# Patient Record
Sex: Male | Born: 1980 | Race: Black or African American | Hispanic: No | Marital: Married | Smoking: Never smoker
Health system: Southern US, Community
[De-identification: ages and names within clinical notes are randomized; demographics above are authoritative.]

## PROBLEM LIST (undated history)

## (undated) DIAGNOSIS — K5090 Crohn's disease, unspecified, without complications: Secondary | ICD-10-CM

---

## 2018-09-08 ENCOUNTER — Ambulatory Visit (HOSPITAL_COMMUNITY)
Admission: EM | Admit: 2018-09-08 | Discharge: 2018-09-08 | Disposition: A | Discharge status: Home / Self Care | Payer: Medicaid Other | Attending: Family Medicine | Admitting: Family Medicine

## 2018-09-08 ENCOUNTER — Encounter (HOSPITAL_COMMUNITY): Payer: Self-pay | Admitting: Family Medicine

## 2018-09-08 DIAGNOSIS — R1084 Generalized abdominal pain: Secondary | ICD-10-CM

## 2018-09-08 DIAGNOSIS — R197 Diarrhea, unspecified: Secondary | ICD-10-CM

## 2018-09-08 MED ORDER — KETOROLAC TROMETHAMINE 30 MG/ML IJ SOLN
30.0000 mg | Freq: Once | INTRAMUSCULAR | Status: AC
  Administered 2018-09-08: 30 mg via INTRAMUSCULAR

## 2018-09-08 MED ORDER — DICYCLOMINE HCL 20 MG PO TABS: 20.0000 mg | ORAL_TABLET | Freq: Two times a day (BID) | ORAL | 0 refills | Status: DC

## 2018-09-08 MED ORDER — NAPROXEN 500 MG PO TABS: 500.0000 mg | ORAL_TABLET | Freq: Two times a day (BID) | ORAL | 0 refills | Status: DC

## 2018-09-08 MED ORDER — KETOROLAC TROMETHAMINE 30 MG/ML IJ SOLN
INTRAMUSCULAR | Status: AC
  Filled 2018-09-08: qty 1

## 2018-09-08 NOTE — ED Triage Notes (Signed)
Provider triage  

## 2018-09-08 NOTE — ED Provider Notes (Signed)
[facility]    CSN: [id number] Arrival date & time: 09/08/18  1531     History   Chief Complaint Chief Complaint  Patient presents with  . Abdominal Pain    HPI Calvin Pittman is a 37 y.o. male.   Patient is a 37 year old male with no known past medical history.  Presents today complaining of abdominal pain, back pain, neck pain and diarrhea.  He reports he has had back pain for over 12 years.  This episode has been worse over the last week.  He denies any cough, congestion, nausea, vomiting, fevers.  Denies any recent traveling out of the country.  He has taken medication before for pain but unsure of the name of it.  The pain in his back is worse with bending, moving, standing.  The neck pain is worse with rotation.    There is a language barrier.  All information obtained using the translator by phone.      History reviewed. No pertinent past medical history.  There are no active problems to display for this patient.   History reviewed. No pertinent surgical history.     Home Medications    Prior to Admission medications   Medication Sig Start Date End Date Taking? Authorizing Provider  dicyclomine (BENTYL) 20 MG tablet Take 1 tablet (20 mg total) by mouth 2 (two) times daily. 09/08/18   Dahlia ByesBast, Zacharius Funari A, NP  naproxen (NAPROSYN) 500 MG tablet Take 1 tablet (500 mg total) by mouth 2 (two) times daily. 09/08/18   Janace ArisBast, Averil Digman A, NP    Family History History reviewed. No pertinent family history.  Social History Social History   Tobacco Use  . Smoking status: Never Smoker  . Smokeless tobacco: Never Used  Substance Use Topics  . Alcohol use: Never    Frequency: Never  . Drug use: Never     Allergies   Patient has no known allergies.   Review of Systems Review of Systems   Physical Exam Triage Vital Signs ED Triage Vitals [09/08/18 1613]  Enc Vitals Group     BP 104/62     Pulse Rate 87     Resp 18     Temp 98.6 F (37 C)     Temp  Source Oral     SpO2 100 %     Weight      Height      Head Circumference      Peak Flow      Pain Score      Pain Loc      Pain Edu?      Excl. in GC?    No data found.  Updated Vital Signs BP 104/62 (BP Location: Left Arm)   Pulse 87   Temp 98.6 F (37 C) (Oral)   Resp 18   SpO2 100%   Visual Acuity Right Eye Distance:   Left Eye Distance:   Bilateral Distance:    Right Eye Near:   Left Eye Near:    Bilateral Near:     Physical Exam  Constitutional: He appears well-developed and well-nourished.  Very pleasant. Non toxic or ill appearing.     HENT:  Head: Normocephalic and atraumatic.  Pulmonary/Chest: Effort normal.  Abdominal: Soft. Normal appearance. There is generalized tenderness. There is no rebound and no CVA tenderness.  Abdomen soft, generalized tenderness. No CVA tenderness. No rebound tenderness.     Musculoskeletal:  TTP of the thoracic spine. No swelling, bruising or deformity.  Tenderness to right and left lateral neck with rotation.   Neurological: He is alert.  Skin: Skin is warm and dry.  Psychiatric: He has a normal mood and affect.  Nursing note and vitals reviewed.    UC Treatments / Results  Labs (all labs ordered are listed, but only abnormal results are displayed) Labs Reviewed - No data to display  EKG None  Radiology No results found.  Procedures Procedures (including critical care time)  Medications Ordered in UC Medications  ketorolac (TORADOL) 30 MG/ML injection 30 mg (30 mg Intramuscular Given 09/08/18 1701)    Initial Impression / Assessment and Plan / UC Course  I have reviewed the triage vital signs and the nursing notes.  Pertinent labs & imaging results that were available during my care of the patient were reviewed by me and considered in my medical decision making (see chart for details).    Gastroenteritis/ Musculoskeletal pain.  Language barrier made it very hard to obtain a good history.  Toradol  injection given here for pain. Sent home with prescriptions for diarrhea, abd cramping and pain in the neck and back.  He denies recent traveling outside the country. Less concern for travelers diarrhea.  He is non toxic or ill appearing  Vitals normal. No fever.   Final Clinical Impressions(s) / UC Diagnoses   Final diagnoses:  Generalized abdominal pain  Diarrhea, unspecified type     Discharge Instructions     It was nice meeting you!!  We gave you medication for diarrhea and pain. Follow up as needed for continued or worsening symptoms      ED Prescriptions    Medication Sig Dispense Auth. Provider   dicyclomine (BENTYL) 20 MG tablet Take 1 tablet (20 mg total) by mouth 2 (two) times daily. 20 tablet Vanessia Bokhari A, NP   naproxen (NAPROSYN) 500 MG tablet Take 1 tablet (500 mg total) by mouth 2 (two) times daily. 30 tablet Dahlia Byes A, NP     Controlled Substance Prescriptions [place] Controlled Substance Registry consulted? Not Applicable   Janace Aris, NP 09/08/18 1711

## 2018-09-08 NOTE — Discharge Instructions (Addendum)
It was nice meeting you!!  We gave you medication for diarrhea and pain. Follow up as needed for continued or worsening symptoms

## 2018-09-29 ENCOUNTER — Ambulatory Visit
Admission: RE | Admit: 2018-09-29 | Discharge: 2018-09-29 | Disposition: A | Discharge status: Home / Self Care | Payer: No Typology Code available for payment source | Source: Ambulatory Visit | Attending: Internal Medicine | Admitting: Internal Medicine

## 2018-09-29 ENCOUNTER — Other Ambulatory Visit: Payer: Self-pay | Admitting: Internal Medicine

## 2018-09-29 DIAGNOSIS — A150 Tuberculosis of lung: Secondary | ICD-10-CM

## 2018-10-14 ENCOUNTER — Other Ambulatory Visit: Payer: Self-pay | Admitting: Family Medicine

## 2018-10-14 ENCOUNTER — Encounter: Payer: Self-pay | Admitting: Family Medicine

## 2018-10-14 ENCOUNTER — Ambulatory Visit (INDEPENDENT_AMBULATORY_CARE_PROVIDER_SITE_OTHER): Payer: Medicaid Other | Admitting: Family Medicine

## 2018-10-14 ENCOUNTER — Telehealth: Payer: Self-pay

## 2018-10-14 VITALS — BP 89/47 | HR 93 | Temp 97.5°F | Resp 17 | Ht 69.5 in | Wt 104.2 lb

## 2018-10-14 DIAGNOSIS — I950 Idiopathic hypotension: Secondary | ICD-10-CM

## 2018-10-14 DIAGNOSIS — R64 Cachexia: Secondary | ICD-10-CM | POA: Diagnosis not present

## 2018-10-14 DIAGNOSIS — M2560 Stiffness of unspecified joint, not elsewhere classified: Secondary | ICD-10-CM

## 2018-10-14 DIAGNOSIS — E049 Nontoxic goiter, unspecified: Secondary | ICD-10-CM | POA: Diagnosis not present

## 2018-10-14 DIAGNOSIS — K2100 Gastro-esophageal reflux disease with esophagitis, without bleeding: Secondary | ICD-10-CM

## 2018-10-14 DIAGNOSIS — R52 Pain, unspecified: Secondary | ICD-10-CM

## 2018-10-14 MED ORDER — METHYLPREDNISOLONE SODIUM SUCC 125 MG IJ SOLR
80.0000 mg | Freq: Once | INTRAMUSCULAR | Status: AC
  Administered 2018-10-14: 80 mg via INTRAMUSCULAR

## 2018-10-14 MED ORDER — OMEPRAZOLE 40 MG PO CPDR: 40.0000 mg | DELAYED_RELEASE_CAPSULE | Freq: Every day | ORAL | 3 refills | Status: DC

## 2018-10-14 MED ORDER — PREDNISONE 20 MG PO TABS: ORAL_TABLET | ORAL | 0 refills | Status: DC

## 2018-10-14 NOTE — Telephone Encounter (Signed)
Called Language [phone]) to schedule an interpreter for patient's next scheduled appointment. Information was taken & they will call back to confirm.

## 2018-10-14 NOTE — Progress Notes (Signed)
Calvin Pittman, is a 37 y.o. male  ZOX:[id number]  [id number]:[id number]  DOB - Jun 28, 1981  CC:  Chief Complaint  Patient presents with  . Establish Care  . Back Pain    c/o back pain & stiffness off and on for 12 years. has pain with bending & certain movement. also has tightness around his shoulder  . Neck Pain    neck pain & stiffness. in order to look to his left he has to turn his whole side.  . Abdominal Pain    was seen at the urgent care & prescribed bentyl. states that that did not help the pain       HPI: Calvin Pittman is a 37 y.o. male is here today to establish care.   Calvin Pittman  Patient is a [place]  refugee and has only been in this country for 30 days. Initially attempted visit with interpreter service, however, technically problems prevented interpreter from translating information during visit. Unable to obtain another interpreter on translation line that speaks preferred language.  Proceeded with visit, patient speaks some English although reports understanding.  Today's visit:  Patient had presented to Urgent Care on 9/12/19n with a complaint of generalized abdominal pain, back pain, neck pain, and diarrhea. Back and neck pain he reports has been present for 12 years without known injury. He treated with Toradol and discharged to follow-up and establish with a PCP. On arrival, patient is extremely hypotensive. Reports his blood pressure is always really low. He asymptomatic of dizziness or weakness. He continues to complain of symptoms of generalized abdominal pain with intake of food which worsen as night. Unable to characterize pain. He reports he has had two endoscopies in this country and placed on medication without relief of abdominal pain symptoms. He is cachetic with a BMI Body mass index is 15.17 kg/m.  Back pain present all the time. He complains of associated neck pain. No prior injury.  He has taken Naproxen without relief. He is unable to completely rotate  his neck to the left. Complains of chronic neck stiffness. He has an enlarged thyroid. No known history of thyroid disease. Denies chest pain, shortness of breath,cough, night sweats, or chills.  Current medications: Current Outpatient Medications:  .  omeprazole (PRILOSEC) 40 MG capsule, Take 1 capsule (40 mg total) by mouth daily., Disp: 30 capsule, Rfl: 3 .  predniSONE (DELTASONE) 20 MG tablet, Take 3 PO QAM x3days, 2 PO QAM x3days, 1 PO QAM x3days, Disp: 18 tablet, Rfl: 0   Pertinent family medical history: family history is not on file.   No Known Allergies  Social History   Socioeconomic History  . Marital status: Married    Spouse name: Not on file  . Number of children: Not on file  . Years of education: Not on file  . Highest education level: Not on file  Occupational History  . Not on file  Social Needs  . Financial resource strain: Not on file  . Food insecurity:    Worry: Not on file    Inability: Not on file  . Transportation needs:    Medical: Not on file    Non-medical: Not on file  Tobacco Use  . Smoking status: Never Smoker  . Smokeless tobacco: Never Used  Substance and Sexual Activity  . Alcohol use: Never    Frequency: Never  . Drug use: Never  . Sexual activity: Not on file  Lifestyle  . Physical activity:    Days per week: Not on  file    Minutes per session: Not on file  . Stress: Not on file  Relationships  . Social connections:    Talks on phone: Not on file    Gets together: Not on file    Attends religious service: Not on file    Active member of club or organization: Not on file    Attends meetings of clubs or organizations: Not on file    Relationship status: Not on file  . Intimate partner violence:    Fear of current or ex partner: Not on file    Emotionally abused: Not on file    Physically abused: Not on file    Forced sexual activity: Not on file  Other Topics Concern  . Not on file  Social History Narrative  . Not on file     Review of Systems: Pertinent negatives listed in HPI  Objective:   Vitals:   10/14/18 1107  BP: (!) 89/47  Pulse: 93  Resp: 17  Temp: (!) 97.5 F (36.4 C)  SpO2: 98%    BP Readings from Last 3 Encounters:  10/14/18 (!) 89/47  09/08/18 104/62    Filed Weights   10/14/18 1107  Weight: 104 lb 3.2 oz (47.3 kg)     Physical Exam  Constitutional: He is oriented to person, place, and time. He appears cachectic. He is active and cooperative.  HENT:  Head: Normocephalic.  Right Ear: External ear normal.  Left Ear: External ear normal.  Neck: Thyromegaly present.  Limited ROM left lateral rotation  Cardiovascular: Normal rate, regular rhythm, normal heart sounds and intact distal pulses.  Pulmonary/Chest: Effort normal and breath sounds normal.  Musculoskeletal: He exhibits no edema.  Neurological: He is alert and oriented to person, place, and time. No cranial nerve deficit. Coordination normal.  Skin: Skin is warm and dry.  Psychiatric: He has a normal mood and affect. His behavior is normal. Judgment and thought content normal.  Lab Results (prior encounters)  No results found for: WBC, HGB, HCT, MCV, PLT Lab Results  Component Value Date   CREATININE 0.74 (L) 10/14/2018   BUN 9 10/14/2018   NA 139 10/14/2018   K 4.7 10/14/2018   CL 99 10/14/2018   CO2 26 10/14/2018       Assessment and plan:  1. Cachexia (HCC), Body mass index is 15.17 kg/m.' - CBC with Differential; Future - Comprehensive metabolic panel; Future - Hemoglobinopathy evaluation; Future - HIV Antibody (routine testing w rflx) - HELICOBACTER PYLORI  ANTIBODY, IGM; Future  2. Goiter,  -Patient will likely warrant a referral to ENT as goiter is severely enlarged. Will refer to endocrinology if thyroid functioning is abnormal. - Thyroid Panel With TSH; Future - PTH, Intact and Calcium; Future  3. Generalized stiffness, chronic - CBC with Differential; Future - Vitamin B12; Future - Vitamin  D, 25-hydroxy; Future - Sedimentation Rate; Future - HELICOBACTER PYLORI  ANTIBODY, IGM; Future - methylPREDNISolone sodium succinate (SOLU-MEDROL) 125 mg/2 mL injection 80 mg - C-reactive protein; Future  4. Idiopathic hypotension, asymptomatic  -Increase hydration with water.   5. Generalized pain, - C-reactive protein, if elevated will consider adding Rheumatoid panel and ANA to rule out autoimmune etiology  6. Gastroesophageal reflux disease with esophagitis, resume omeprazole  Return in about 1 week (around 10/21/2018).   The patient was given clear instructions to go to ER or return to medical center if symptoms don't improve, worsen or new problems develop. The patient verbalized understanding. The patient was  advised  to call and obtain lab results if they haven't heard anything from out office within 7-10 business days.  A total of 50 minutes spent, greater than 50 % of this time was spent, communicating given language barrier, counseling and coordination of care.  Joaquin Courts, FNP Primary Care at [facility] [address] [place] [address] [phone]: [phone]    This note has been created with Dragon speech recognition software and Paediatric nurse. Any transcriptional errors are unintentional.

## 2018-10-14 NOTE — Patient Instructions (Signed)
Go to [facility] to have lab work drawn before starting medication.  Your medication are at the pharmacy.

## 2018-10-17 LAB — HELICOBACTER PYLORI  ANTIBODY, IGM: H pylori, IgM Abs: <9 units (ref 0.0–8.9)

## 2018-10-17 LAB — COMPREHENSIVE METABOLIC PANEL
A/G RATIO: 0.8 — AB (ref 1.2–2.2)
ALK PHOS: 124 IU/L — AB (ref 39–117)
ALT: 6 IU/L (ref 0–44)
AST: 18 IU/L (ref 0–40)
Albumin: 3.5 g/dL (ref 3.5–5.5)
BUN/Creatinine Ratio: 12 (ref 9–20)
BUN: 9 mg/dL (ref 6–20)
Bilirubin Total: 0.2 mg/dL (ref 0.0–1.2)
CHLORIDE: 99 mmol/L (ref 96–106)
CO2: 26 mmol/L (ref 20–29)
Calcium: 9.1 mg/dL (ref 8.7–10.2)
Creatinine, Ser: 0.74 mg/dL — ABNORMAL LOW (ref 0.76–1.27)
GFR calc Af Amer: 138 mL/min/{1.73_m2} (ref >59)
GFR calc non Af Amer: 120 mL/min/{1.73_m2} (ref >59)
GLOBULIN, TOTAL: 4.3 g/dL (ref 1.5–4.5)
Glucose: 95 mg/dL (ref 65–99)
POTASSIUM: 4.7 mmol/L (ref 3.5–5.2)
SODIUM: 139 mmol/L (ref 134–144)
Total Protein: 7.8 g/dL (ref 6.0–8.5)

## 2018-10-17 LAB — CBC WITH DIFFERENTIAL/PLATELET
BASOS: 0 %
Basophils Absolute: 0 10*3/uL (ref 0.0–0.2)
EOS (ABSOLUTE): 0 10*3/uL (ref 0.0–0.4)
EOS: 1 %
HEMATOCRIT: 36.4 % — AB (ref 37.5–51.0)
Hemoglobin: 10.9 g/dL — ABNORMAL LOW (ref 13.0–17.7)
IMMATURE GRANULOCYTES: 0 %
Immature Grans (Abs): 0 10*3/uL (ref 0.0–0.1)
LYMPHS ABS: 0.6 10*3/uL — AB (ref 0.7–3.1)
Lymphs: 12 %
MCH: 20.8 pg — ABNORMAL LOW (ref 26.6–33.0)
MCHC: 29.9 g/dL — AB (ref 31.5–35.7)
MCV: 69 fL — AB (ref 79–97)
MONOS ABS: 0.1 10*3/uL (ref 0.1–0.9)
Monocytes: 2 %
NEUTROS PCT: 85 %
Neutrophils Absolute: 4.1 10*3/uL (ref 1.4–7.0)
PLATELETS: 441 10*3/uL (ref 150–450)
RBC: 5.25 x10E6/uL (ref 4.14–5.80)
RDW: 17.7 % — AB (ref 12.3–15.4)
WBC: 4.8 10*3/uL (ref 3.4–10.8)

## 2018-10-17 LAB — PTH, INTACT AND CALCIUM: PTH: 24 pg/mL (ref 15–65)

## 2018-10-17 LAB — THYROID PANEL WITH TSH
Free Thyroxine Index: 2.7 (ref 1.2–4.9)
T3 Uptake Ratio: 31 % (ref 24–39)
T4 TOTAL: 8.7 ug/dL (ref 4.5–12.0)
TSH: 2.25 u[IU]/mL (ref 0.450–4.500)

## 2018-10-17 LAB — HEMOGLOBINOPATHY EVALUATION
HGB A: 98.1 % (ref 96.4–98.8)
HGB C: 0 %
HGB S: 0 %
HGB VARIANT: 0 %
Hemoglobin A2 Quantitation: 1.9 % (ref 1.8–3.2)
Hemoglobin F Quantitation: 0 % (ref 0.0–2.0)

## 2018-10-17 LAB — HIV ANTIBODY (ROUTINE TESTING W REFLEX): HIV Screen 4th Generation wRfx: NONREACTIVE

## 2018-10-17 LAB — VITAMIN B12: Vitamin B-12: 552 pg/mL (ref 232–1245)

## 2018-10-17 LAB — VITAMIN D 25 HYDROXY (VIT D DEFICIENCY, FRACTURES): Vit D, 25-Hydroxy: 13 ng/mL — ABNORMAL LOW (ref 30.0–100.0)

## 2018-10-17 LAB — C-REACTIVE PROTEIN: CRP: 81 mg/L — ABNORMAL HIGH (ref 0–10)

## 2018-10-17 LAB — SEDIMENTATION RATE: SED RATE: 69 mm/h — AB (ref 0–15)

## 2018-10-21 ENCOUNTER — Encounter: Payer: Self-pay | Admitting: Family Medicine

## 2018-10-21 ENCOUNTER — Ambulatory Visit (INDEPENDENT_AMBULATORY_CARE_PROVIDER_SITE_OTHER): Payer: Medicaid Other | Admitting: Family Medicine

## 2018-10-21 ENCOUNTER — Telehealth: Payer: Self-pay

## 2018-10-21 VITALS — BP 107/69 | HR 89 | Temp 97.4°F | Resp 17 | Ht 69.5 in | Wt 108.6 lb

## 2018-10-21 DIAGNOSIS — R7989 Other specified abnormal findings of blood chemistry: Secondary | ICD-10-CM | POA: Diagnosis not present

## 2018-10-21 DIAGNOSIS — M2560 Stiffness of unspecified joint, not elsewhere classified: Secondary | ICD-10-CM

## 2018-10-21 DIAGNOSIS — D649 Anemia, unspecified: Secondary | ICD-10-CM

## 2018-10-21 DIAGNOSIS — R195 Other fecal abnormalities: Secondary | ICD-10-CM

## 2018-10-21 DIAGNOSIS — R64 Cachexia: Secondary | ICD-10-CM | POA: Diagnosis not present

## 2018-10-21 DIAGNOSIS — Z789 Other specified health status: Secondary | ICD-10-CM

## 2018-10-21 MED ORDER — FERROUS SULFATE 325 (65 FE) MG PO TABS: 325.0000 mg | ORAL_TABLET | Freq: Two times a day (BID) | ORAL | 1 refills | Status: DC

## 2018-10-21 MED ORDER — VITAMIN D (ERGOCALCIFEROL) 1.25 MG (50000 UNIT) PO CAPS: 50000.0000 [IU] | ORAL_CAPSULE | ORAL | 0 refills | Status: DC

## 2018-10-21 NOTE — Progress Notes (Signed)
Patient ID: Calvin Pittman, male    DOB: 1981-12-26, 37 y.o.   MRN: [id number]  PCP: Scot Jun, FNP  Chief Complaint  Patient presents with  . Results    here to go over recent labs   Interpreter used to facilitate continuum of care and promote health. Subjective:  HPI Calvin Pittman is a 37 y.o. male presents for a follow-up visit. Patient established care and was evaluated on 10/14/2018 for multiple complaints including generalized abdominal pain, diarrhea, hypotension, and generalized body aches. He was placed on omeprazole for GI symptoms and prednisone for generalized body pain. Reports today increased appetite and pain has improved. He continues to have loose stool although not diarrhea. He has had problems receiving adequate nutrition as he has not received his nutritional supplement card. Lab were significant for severe vitamin D deficiency and microcytic anemia.nonspecific labs ESR and sed rate were elevated, however, there no comparison values available to determine patient's baseline. Social History   Socioeconomic History  . Marital status: Married    Spouse name: Not on file  . Number of children: Not on file  . Years of education: Not on file  . Highest education level: Not on file  Occupational History  . Not on file  Social Needs  . Financial resource strain: Not on file  . Food insecurity:    Worry: Not on file    Inability: Not on file  . Transportation needs:    Medical: Not on file    Non-medical: Not on file  Tobacco Use  . Smoking status: Never Smoker  . Smokeless tobacco: Never Used  Substance and Sexual Activity  . Alcohol use: Never    Frequency: Never  . Drug use: Never  . Sexual activity: Not on file  Lifestyle  . Physical activity:    Days per week: Not on file    Minutes per session: Not on file  . Stress: Not on file  Relationships  . Social connections:    Talks on phone: Not on file    Gets together: Not on file   Attends religious service: Not on file    Active member of club or organization: Not on file    Attends meetings of clubs or organizations: Not on file    Relationship status: Not on file  . Intimate partner violence:    Fear of current or ex partner: Not on file    Emotionally abused: Not on file    Physically abused: Not on file    Forced sexual activity: Not on file  Other Topics Concern  . Not on file  Social History Narrative  . Not on file    No family history on file.   Review of Systems Pertinent negatives listed in HPI Prior to Admission medications   Medication Sig Start Date End Date Taking? Authorizing Provider  omeprazole (PRILOSEC) 40 MG capsule Take 1 capsule (40 mg total) by mouth daily. 10/14/18  Yes Scot Jun, FNP  predniSONE (DELTASONE) 20 MG tablet Take 3 PO QAM x3days, 2 PO QAM x3days, 1 PO QAM x3days 10/14/18  Yes Scot Jun, FNP    Past Medical, Surgical Family and Social History reviewed and updated.    Objective:   Today's Vitals   10/21/18 1056  BP: 107/69  Pulse: 89  Resp: 17  Temp: (!) 97.4 F (36.3 C)  TempSrc: Oral  SpO2: 99%  Weight: 108 lb 9.6 oz (49.3 kg)  Height: 5' 9.5" (1.765 m)  Wt Readings from Last 3 Encounters:  10/21/18 108 lb 9.6 oz (49.3 kg)  10/14/18 104 lb 3.2 oz (47.3 kg)     Physical Exam General appearance: alert, cachexia , cooperative and in no distress Head: Normocephalic, without obvious abnormality, atraumatic Respiratory: Respirations even and unlabored, normal respiratory rate Heart: rate and rhythm normal. No gallop or murmurs noted on exam  Extremities: No gross deformities Skin: Skin color, texture, turgor normal. No rashes seen  Psych: Appropriate mood and affect. Neurologic: Mental status: Alert, oriented to person, place, and time, thought content appropriate.   Assessment & Plan:  1. Cachexia [facility]) -Information given to patient regarding food resources available. -SNAP  benefits pending  - Will closely monitor weight at subsequent encounters.  2. Loose stools -suspect this poor fiber intake related. -provided information regarding improving fiber intake - if persists , will trial IBS treatment   3. Generalized stiffness, improving -complete prednisone   4. Low serum vitamin D -likely cause of muscle pain and stiffness complaint -start ergocalciferol 50,000 units weekly x 12 weeks  -repeat level in 12 weeks   5. Anemia, unspecified type -start oral iron replacement -check iron panel at 6 week follow-up  6. Language Barrier interpreter used to facilitate continuum of care and promote health.  Meds ordered this encounter  Medications  . Vitamin D, Ergocalciferol, (DRISDOL) 50000 units CAPS capsule    Sig: Take 1 capsule (50,000 Units total) by mouth every 7 (seven) days.    Dispense:  24 capsule    Refill:  0  . ferrous sulfate (FERROUSUL) 325 (65 FE) MG tablet    Sig: Take 1 tablet (325 mg total) by mouth 2 (two) times daily with a meal.    Dispense:  60 tablet    Refill:  1   A total of 30  minutes spent, greater than 50 % of this time was spent counseling and coordination of care.  -The patient was given clear instructions to go to ER or return to medical center if symptoms do not improve, worsen or new problems develop. The patient verbalized understanding.    Molli Barrows, FNP Primary Care at [facility] [address] [place] [phone]: [phone]

## 2018-10-21 NOTE — Patient Instructions (Addendum)
For food pantry go to:  [facility]  [address] [place], [place] [address] [phone]     Ergocalciferol, Vitamin D2 oral drops and solution What is this medicine? ERGOCALCIFEROL (er goe kal SIF e role) is a man-made vitamin D. It helps the body maintain the right amount of calcium and phosphorus. This medicine is used to prevent and to treat low vitamin D, to promote strong bones and teeth, and in the treatment of some diseases. This medicine may be used for other purposes; ask your health care provider or pharmacist if you have questions. COMMON BRAND NAME(S): Calcidol, Calciferol, Drisdol What should I tell my health care provider before I take this medicine? They need to know if you have any of the following conditions: -kidney disease -liver disease -parathyroid disease -stomach problems -an unusual or allergic reaction to vitamin D, other medicines, foods, dyes, or preservatives -pregnant or trying to get pregnant -breast-feeding How should I use this medicine? Take this medicine by mouth. Follow the directions on the prescription label. Use a specially marked spoon or container to measure each dose. Ask your pharmacist if you do not have one. Household spoons are not accurate. This medicine can be taken directly into the mouth or added to cereal, fruit juice, or other foods. Take your medicine at regular intervals. Do not take it more often than directed. Do not stop taking except on your doctor's advice. Talk to your pediatrician regarding the use of this medicine in children. While this drug may be prescribed for selected conditions, precautions do apply. Overdosage: If you think you have taken too much of this medicine contact a poison control center or emergency room at once. NOTE: This medicine is only for you. Do not share this medicine with others. What if I miss a dose? If you miss a dose, take it as soon as you can. If it is almost time for your  next dose, take only that dose. Do not take double or extra doses. What may interact with this medicine? -antacids -digoxin -diuretics -medicines for cholesterol like colestipol or cholestyramine -medicines to treat seizures or nerve pain -mineral oil -orlistat This list may not describe all possible interactions. Give your health care provider a list of all the medicines, herbs, non-prescription drugs, or dietary supplements you use. Also tell them if you smoke, drink alcohol, or use illegal drugs. Some items may interact with your medicine. What should I watch for while using this medicine? Visit your doctor or health care professional for regular checks on your progress. Your doctor may order blood work while you are taking this vitamin. You may need a special diet or other supplements. Ask your doctor or health care professional before you take any non-prescription medicines that have vitamin D, phosphorus, magnesium, or calcium (like antacids) in them. This can cause side effects. Take this vitamin only if your doctor prescribes it or if you do not receive enough vitamins in your diet. A balanced diet should give you all of the vitamins you need. Fish and fish liver oils naturally contain vitamin D, and vitamin D is added to milk and bread. Also, your body makes vitamin D when you are in the sun. What side effects may I notice from receiving this medicine? Side effects that you should report to your doctor or health care professional as soon as possible: -allergic reactions like skin rash, itching or hives, swelling of the face, lips, or tongue -bone pain -high blood pressure -increased  thirst -increased urination (especially at night) -irregular heartbeat -seizures -unusually weak or tired Side effects that usually do not require medical attention (report to your doctor or health care professional if they continue or are bothersome): -constipation -dry mouth -headache -loss of  appetite -metallic taste -stomach upset This list may not describe all possible side effects. Call your doctor for medical advice about side effects. You may report side effects to FDA at 1-800-FDA-1088. Where should I keep my medicine? Keep out of the reach of children. Store at room temperature between 15 and 30 degrees C (59 and 86 degrees F). Protect from light. Keep container tightly closed. Throw away any unused medicine after the expiration date. NOTE: This sheet is a summary. It may not cover all possible information. If you have questions about this medicine, talk to your doctor, pharmacist, or health care provider.  2018 Elsevier/Gold Standard (2008-04-17 11:33:11)   High-Fiber Diet Fiber, also called dietary fiber, is a type of carbohydrate found in fruits, vegetables, whole grains, and beans. A high-fiber diet can have many health benefits. Your health care provider may recommend a high-fiber diet to help:  Prevent constipation. Fiber can make your bowel movements more regular.  Lower your cholesterol.  Relieve hemorrhoids, uncomplicated diverticulosis, or irritable bowel syndrome.  Prevent overeating as part of a weight-loss plan.  Prevent heart disease, type 2 diabetes, and certain cancers.  What is my plan? The recommended daily intake of fiber includes:  38 grams for men under age 11.  30 grams for men over age 29.  25 grams for women under age 41.  21 grams for women over age 61.  You can get the recommended daily intake of dietary fiber by eating a variety of fruits, vegetables, grains, and beans. Your health care provider may also recommend a fiber supplement if it is not possible to get enough fiber through your diet. What do I need to know about a high-fiber diet?  Fiber supplements have not been widely studied for their effectiveness, so it is better to get fiber through food sources.  Always check the fiber content on thenutrition facts label of any  prepackaged food. Look for foods that contain at least 5 grams of fiber per serving.  Ask your dietitian if you have questions about specific foods that are related to your condition, especially if those foods are not listed in the following section.  Increase your daily fiber consumption gradually. Increasing your intake of dietary fiber too quickly may cause bloating, cramping, or gas.  Drink plenty of water. Water helps you to digest fiber. What foods can I eat? Grains Whole-grain breads. Multigrain cereal. Oats and oatmeal. Brown rice. Barley. Bulgur wheat. Millet. Bran muffins. Popcorn. Rye wafer crackers. Vegetables Sweet potatoes. Spinach. Kale. Artichokes. Cabbage. Broccoli. Green peas. Carrots. Squash. Fruits Berries. Pears. Apples. Oranges. Avocados. Prunes and raisins. Dried figs. Meats and Other Protein Sources Navy, kidney, pinto, and soy beans. Split peas. Lentils. Nuts and seeds. Dairy Fiber-fortified yogurt. Beverages Fiber-fortified soy milk. Fiber-fortified orange juice. Other Fiber bars. The items listed above may not be a complete list of recommended foods or beverages. Contact your dietitian for more options. What foods are not recommended? Grains White bread. Pasta made with refined flour. White rice. Vegetables Fried potatoes. Canned vegetables. Well-cooked vegetables. Fruits Fruit juice. Cooked, strained fruit. Meats and Other Protein Sources Fatty cuts of meat. Fried Environmental education officer or fried fish. Dairy Milk. Yogurt. Cream cheese. Sour cream. Beverages Soft drinks. Other Cakes and pastries. Butter  and oils. The items listed above may not be a complete list of foods and beverages to avoid. Contact your dietitian for more information. What are some tips for including high-fiber foods in my diet?  Eat a wide variety of high-fiber foods.  Make sure that half of all grains consumed each day are whole grains.  Replace breads and cereals made from refined flour  or white flour with whole-grain breads and cereals.  Replace white rice with brown rice, bulgur wheat, or millet.  Start the day with a breakfast that is high in fiber, such as a cereal that contains at least 5 grams of fiber per serving.  Use beans in place of meat in soups, salads, or pasta.  Eat high-fiber snacks, such as berries, raw vegetables, nuts, or popcorn. This information is not intended to replace advice given to you by your health care provider. Make sure you discuss any questions you have with your health care provider. Document Released: 12/14/2005 Document Revised: 05/21/2016 Document Reviewed: 05/29/2014 Elsevier Interactive Patient Education  [employer].

## 2018-10-21 NOTE — Telephone Encounter (Signed)
Called Language Services([phone]) to schedule an interpreter for patient's next scheduled appointment. Information was taken & they will call back to confirm. 

## 2018-10-27 ENCOUNTER — Encounter: Payer: Self-pay | Admitting: Family Medicine

## 2018-10-27 ENCOUNTER — Ambulatory Visit (INDEPENDENT_AMBULATORY_CARE_PROVIDER_SITE_OTHER): Payer: Medicaid Other | Admitting: Family Medicine

## 2018-10-27 VITALS — BP 110/74 | HR 84 | Temp 98.0°F | Resp 17 | Ht 69.5 in | Wt 110.6 lb

## 2018-10-27 DIAGNOSIS — N529 Male erectile dysfunction, unspecified: Secondary | ICD-10-CM

## 2018-10-27 DIAGNOSIS — M79604 Pain in right leg: Secondary | ICD-10-CM | POA: Insufficient documentation

## 2018-10-27 MED ORDER — ACETAMINOPHEN 500 MG PO TABS: 500.0000 mg | ORAL_TABLET | Freq: Four times a day (QID) | ORAL | 0 refills | Status: DC | PRN

## 2018-10-27 MED ORDER — SILDENAFIL CITRATE 25 MG PO TABS: 25.0000 mg | ORAL_TABLET | Freq: Every day | ORAL | 0 refills | Status: DC | PRN

## 2018-10-27 NOTE — Patient Instructions (Signed)

## 2018-10-27 NOTE — Progress Notes (Signed)
Patient ID: Calvin Pittman, male    DOB: 28-Nov-1981, 37 y.o.   MRN: [id number]  PCP: Scot Jun, FNP  Chief Complaint  Patient presents with  . Erectile Dysfunction    x 1 month. no prior history.    Subjective:  HPI Calvin Pittman is a 37 y.o. male presents for evaluation inability to maintain and erection and right upper leg pain.  Erectile Dysfunction  He complains of worsening ability to maintain an erection occurring over the last 2 months. He reports that he and his wife had been apart for sometime as she arrived in the [place] several months prior to him. He has been in the [place] for 2 months. He denies history of erectile dysfunction or infertility. He has two children. Denies anxiety or depression. He is not experiencing urinary retention or urine urgency symptoms.    Right Leg Pain  Patient has complained of intermittent generalized body aches and pain. Today complains of right upper leg pain which began a few days ago. He is currently taking vitamin D replacement for severe vitamin d deficit. He denies injury.   Social History   Socioeconomic History  . Marital status: Married    Spouse name: Not on file  . Number of children: Not on file  . Years of education: Not on file  . Highest education level: Not on file  Occupational History  . Not on file  Social Needs  . Financial resource strain: Not on file  . Food insecurity:    Worry: Not on file    Inability: Not on file  . Transportation needs:    Medical: Not on file    Non-medical: Not on file  Tobacco Use  . Smoking status: Never Smoker  . Smokeless tobacco: Never Used  Substance and Sexual Activity  . Alcohol use: Never    Frequency: Never  . Drug use: Never  . Sexual activity: Not on file  Lifestyle  . Physical activity:    Days per week: Not on file    Minutes per session: Not on file  . Stress: Not on file  Relationships  . Social connections:    Talks on phone: Not on file    Gets  together: Not on file    Attends religious service: Not on file    Active member of club or organization: Not on file    Attends meetings of clubs or organizations: Not on file    Relationship status: Not on file  . Intimate partner violence:    Fear of current or ex partner: Not on file    Emotionally abused: Not on file    Physically abused: Not on file    Forced sexual activity: Not on file  Other Topics Concern  . Not on file  Social History Narrative  . Not on file    No family history on file.   Review of Systems Pertinent negatives listed in HPI  No Known Allergies  Prior to Admission medications   Medication Sig Start Date End Date Taking? Authorizing Provider  ferrous sulfate (FERROUSUL) 325 (65 FE) MG tablet Take 1 tablet (325 mg total) by mouth 2 (two) times daily with a meal. 10/21/18   Scot Jun, FNP  omeprazole (PRILOSEC) 40 MG capsule Take 1 capsule (40 mg total) by mouth daily. 10/14/18   Scot Jun, FNP  Vitamin D, Ergocalciferol, (DRISDOL) 50000 units CAPS capsule Take 1 capsule (50,000 Units total) by mouth every 7 (seven) days. 10/21/18  Scot Jun, FNP    Past Medical, Surgical Family and Social History reviewed and updated.    Objective:   Today's Vitals   10/27/18 0832  BP: 110/74  Pulse: 84  Resp: 17  Temp: 98 F (36.7 C)  TempSrc: Oral  SpO2: 99%  Weight: 110 lb 9.6 oz (50.2 kg)  Height: 5' 9.5" (1.765 m)    Wt Readings from Last 3 Encounters:  10/27/18 110 lb 9.6 oz (50.2 kg)  10/21/18 108 lb 9.6 oz (49.3 kg)  10/14/18 104 lb 3.2 oz (47.3 kg)   Physical Exam General appearance: alert, well developed, well nourished, cooperative and in no distress Head: Normocephalic, without obvious abnormality, atraumatic Respiratory: Respirations even and unlabored, normal respiratory rate Heart: rate and rhythm normal. No gallop or murmurs noted on exam  Extremities: No gross deformities Skin: Skin color, texture,  turgor normal. No rashes seen  Psych: Appropriate mood and affect. Neurologic: Mental status: Alert, oriented to person, place, and time, thought content appropriate.  Assessment & Plan:  1. Erectile dysfunction, unspecified erectile dysfunction type -Uncertain if this organic or psychological in nature. Agreed to trial a low-dose sildenafil 25 mg one hour prior to sexual activity.   2. Pain of right lower extremity, patient has chronic musculoskeletal pain. He has not routinely trial any OTC for symptoms. He has complete ROM. Recent labs indicated elevated ESR/Sed rate which is non-specific although could correlate with patient MSK pain and with likely inflammation. He recently completed prednisone and current pain is only present for less than 24 hours. Recommended OTC therapy with acetaminophen.    Meds ordered this encounter  Medications  . sildenafil (VIAGRA) 25 MG tablet    Sig: Take 1 tablet (25 mg total) by mouth daily as needed for erectile dysfunction (one hour prior to sexual activity).    Dispense:  10 tablet    Refill:  0  . acetaminophen (TYLENOL) 500 MG tablet    Sig: Take 1 tablet (500 mg total) by mouth every 6 (six) hours as needed.    Dispense:  30 tablet    Refill:  0    Return for follow-up of ED and chronic pain.     -The patient was given clear instructions to go to ER or return to medical center if symptoms do not improve, worsen or new problems develop. The patient verbalized understanding.    Molli Barrows, FNP Primary Care at [facility] [address], [place] [phone]: [phone]

## 2018-12-02 ENCOUNTER — Encounter: Payer: Self-pay | Admitting: Family Medicine

## 2018-12-02 ENCOUNTER — Other Ambulatory Visit: Payer: Medicaid Other

## 2018-12-02 ENCOUNTER — Ambulatory Visit (INDEPENDENT_AMBULATORY_CARE_PROVIDER_SITE_OTHER): Payer: Medicaid Other | Admitting: Family Medicine

## 2018-12-02 VITALS — BP 103/68 | HR 75 | Temp 97.7°F | Resp 17 | Ht 69.5 in | Wt 102.0 lb

## 2018-12-02 DIAGNOSIS — J988 Other specified respiratory disorders: Secondary | ICD-10-CM | POA: Diagnosis not present

## 2018-12-02 DIAGNOSIS — B9789 Other viral agents as the cause of diseases classified elsewhere: Secondary | ICD-10-CM

## 2018-12-02 DIAGNOSIS — D508 Other iron deficiency anemias: Secondary | ICD-10-CM | POA: Diagnosis not present

## 2018-12-02 MED ORDER — BENZONATATE 100 MG PO CAPS: 100.0000 mg | ORAL_CAPSULE | Freq: Three times a day (TID) | ORAL | 0 refills | Status: DC | PRN

## 2018-12-02 MED ORDER — CETIRIZINE HCL 10 MG PO TABS: 10.0000 mg | ORAL_TABLET | Freq: Every day | ORAL | 11 refills | Status: DC

## 2018-12-02 NOTE — Patient Instructions (Signed)

## 2018-12-02 NOTE — Progress Notes (Signed)
Acute Office Visit  Subjective:    Patient ID: Calvin Pittman, male    DOB: 04/13/1981, 37 y.o.   MRN: [id number]  Chief Complaint  Patient presents with  . URI    HPI Patient is in today with a complaint of cough and body aches. He is afebrile. Denies sick contacts. He has taken ibuprofen only with improvement of a prior headache. Endorses a mild sore throat. Cough is at times productive of scant mucus. Denies nausea, vomiting, chills, or weakness.  Social History   Socioeconomic History  . Marital status: Married    Spouse name: Not on file  . Number of children: 12  . Years of education: Not on file  . Highest education level: Not on file  Occupational History  . Not on file  Social Needs  . Financial resource strain: Not on file  . Food insecurity:    Worry: Not on file    Inability: Not on file  . Transportation needs:    Medical: Not on file    Non-medical: Not on file  Tobacco Use  . Smoking status: Never Smoker  . Smokeless tobacco: Never Used  Substance and Sexual Activity  . Alcohol use: Never    Frequency: Never  . Drug use: Never  . Sexual activity: Yes    Partners: Female  Lifestyle  . Physical activity:    Days per week: Not on file    Minutes per session: Not on file  . Stress: Not on file  Relationships  . Social connections:    Talks on phone: Not on file    Gets together: Not on file    Attends religious service: Not on file    Active member of club or organization: Not on file    Attends meetings of clubs or organizations: Not on file    Relationship status: Not on file  . Intimate partner violence:    Fear of current or ex partner: Not on file    Emotionally abused: Not on file    Physically abused: Not on file    Forced sexual activity: Not on file  Other Topics Concern  . Not on file  Social History Narrative  . Not on file    Outpatient Medications Prior to Visit  Medication Sig Dispense Refill  . acetaminophen (TYLENOL)  500 MG tablet Take 1 tablet (500 mg total) by mouth every 6 (six) hours as needed. 30 tablet 0  . ferrous sulfate (FERROUSUL) 325 (65 FE) MG tablet Take 1 tablet (325 mg total) by mouth 2 (two) times daily with a meal. 60 tablet 1  . omeprazole (PRILOSEC) 40 MG capsule Take 1 capsule (40 mg total) by mouth daily. 30 capsule 3  . sildenafil (VIAGRA) 25 MG tablet Take 1 tablet (25 mg total) by mouth daily as needed for erectile dysfunction (one hour prior to sexual activity). 10 tablet 0  . Vitamin D, Ergocalciferol, (DRISDOL) 50000 units CAPS capsule Take 1 capsule (50,000 Units total) by mouth every 7 (seven) days. 24 capsule 0   No facility-administered medications prior to visit.     No Known Allergies  ROS  Endorses cough, chest tightness, and fatigue Denies chest pain, headaches, new weakness, abdominal pain, edema, urinary retention, urinary frequency, or wheezing. Objective:    Physical Exam General appearance: alert, well developed, well nourished, cooperative and in no distress Head: Normocephalic, without obvious abnormality, atraumatic Respiratory: Respirations even and unlabored, normal respiratory rate Cardiovascular: Regular rhythm and rate. Negative  murmurs, gallops, or rubs. Extremities: No gross deformities Skin: Skin color, texture, turgor normal. No rashes seen  Psych: Appropriate mood and affect. Neurologic: Mental status: Alert, oriented to person, place, and time, thought content appropriate. BP 103/68   Pulse 75   Temp 97.7 F (36.5 C) (Oral)   Resp 17   Ht 5' 9.5" (1.765 m)   Wt 102 lb (46.3 kg)   SpO2 97%   BMI 14.85 kg/m  Wt Readings from Last 3 Encounters:  12/02/18 102 lb (46.3 kg)  10/27/18 110 lb 9.6 oz (50.2 kg)  10/21/18 108 lb 9.6 oz (49.3 kg)    Health Maintenance Due  Topic Date Due  . TETANUS/TDAP  12/28/2001    There are no preventive care reminders to display for this patient.   Lab Results  Component Value Date   TSH 2.250  10/14/2018   Lab Results  Component Value Date   WBC 4.8 10/14/2018   HGB 10.9 (L) 10/14/2018   HCT 36.4 (L) 10/14/2018   MCV 69 (L) 10/14/2018   PLT 441 10/14/2018   Lab Results  Component Value Date   NA 139 10/14/2018   K 4.7 10/14/2018   CO2 26 10/14/2018   GLUCOSE 95 10/14/2018   BUN 9 10/14/2018   CREATININE 0.74 (L) 10/14/2018   BILITOT 0.2 10/14/2018   ALKPHOS 124 (H) 10/14/2018   AST 18 10/14/2018   ALT 6 10/14/2018   PROT 7.8 10/14/2018   ALBUMIN 3.5 10/14/2018   CALCIUM 9.1 10/14/2018      Assessment & Plan:   Problem List Items Addressed This Visit    None    Visit Diagnoses    Other iron deficiency anemia    -  Primary   Relevant Orders   CBC with Differential   Iron, TIBC and Ferritin Panel       Meds ordered this encounter  Medications  . DISCONTD: cetirizine (ZYRTEC) 10 MG tablet    Sig: Take 1 tablet (10 mg total) by mouth daily.    Dispense:  30 tablet    Refill:  11  . DISCONTD: benzonatate (TESSALON) 100 MG capsule    Sig: Take 1-2 capsules (100-200 mg total) by mouth 3 (three) times daily as needed for cough.    Dispense:  40 capsule    Refill:  0  . benzonatate (TESSALON) 100 MG capsule    Sig: Take 1-2 capsules (100-200 mg total) by mouth 3 (three) times daily as needed for cough.    Dispense:  40 capsule    Refill:  0  . cetirizine (ZYRTEC) 10 MG tablet    Sig: Take 1 tablet (10 mg total) by mouth daily.    Dispense:  30 tablet    Refill:  11     Follow-up as needed.   Joaquin Courts, FNP Primary Care at [facility] [address], [place] [address] [phone]: [phone]

## 2018-12-03 LAB — IRON,TIBC AND FERRITIN PANEL
FERRITIN: 105 ng/mL (ref 30–400)
IRON SATURATION: 6 % — AB (ref 15–55)
IRON: 14 ug/dL — AB (ref 38–169)
TIBC: 220 ug/dL — AB (ref 250–450)
UIBC: 206 ug/dL (ref 111–343)

## 2018-12-03 LAB — CBC WITH DIFFERENTIAL/PLATELET
BASOS: 1 %
Basophils Absolute: 0 10*3/uL (ref 0.0–0.2)
EOS (ABSOLUTE): 0.1 10*3/uL (ref 0.0–0.4)
Eos: 3 %
HEMOGLOBIN: 11.1 g/dL — AB (ref 13.0–17.7)
Hematocrit: 37.3 % — ABNORMAL LOW (ref 37.5–51.0)
IMMATURE GRANS (ABS): 0 10*3/uL (ref 0.0–0.1)
IMMATURE GRANULOCYTES: 0 %
LYMPHS: 22 %
Lymphocytes Absolute: 0.9 10*3/uL (ref 0.7–3.1)
MCH: 20.8 pg — AB (ref 26.6–33.0)
MCHC: 29.8 g/dL — ABNORMAL LOW (ref 31.5–35.7)
MCV: 70 fL — ABNORMAL LOW (ref 79–97)
MONOS ABS: 0.5 10*3/uL (ref 0.1–0.9)
Monocytes: 12 %
NEUTROS ABS: 2.6 10*3/uL (ref 1.4–7.0)
Neutrophils: 62 %
PLATELETS: 380 10*3/uL (ref 150–450)
RBC: 5.34 x10E6/uL (ref 4.14–5.80)
RDW: 19.2 % — ABNORMAL HIGH (ref 12.3–15.4)
WBC: 4.1 10*3/uL (ref 3.4–10.8)

## 2018-12-05 ENCOUNTER — Telehealth: Payer: Self-pay | Admitting: Family Medicine

## 2018-12-05 NOTE — Telephone Encounter (Signed)
Please contact the Patient Care Center [phone]  and schedule patient for an iron infusion. Use interpreter service to notify patient that his iron is low and is requires an infusion to correct level.    Once appointment is schedule, please notify me and I will place orders for infusion.

## 2018-12-05 NOTE — Telephone Encounter (Signed)
Left voice mail to call back 

## 2018-12-06 NOTE — Telephone Encounter (Signed)
Patient notified of lab results & recommendations. Prefers to be scheduled after 10 AM due to having to ride the bus.

## 2018-12-07 NOTE — Telephone Encounter (Signed)
Patient is scheduled for his iron infusion at 11 AM on 12/15/18.

## 2018-12-15 ENCOUNTER — Other Ambulatory Visit: Payer: Self-pay | Admitting: Family Medicine

## 2018-12-15 ENCOUNTER — Ambulatory Visit (HOSPITAL_COMMUNITY)
Admission: RE | Admit: 2018-12-15 | Discharge: 2018-12-15 | Disposition: A | Discharge status: Home / Self Care | Payer: Medicaid Other | Source: Ambulatory Visit | Attending: Family Medicine | Admitting: Family Medicine

## 2018-12-15 DIAGNOSIS — D500 Iron deficiency anemia secondary to blood loss (chronic): Secondary | ICD-10-CM

## 2018-12-15 MED ORDER — SODIUM CHLORIDE 0.9 % IV SOLN
INTRAVENOUS | Status: DC | PRN
  Administered 2018-12-15: 250 mL via INTRAVENOUS

## 2018-12-15 MED ORDER — SODIUM CHLORIDE 0.9 % IV SOLN
510.0000 mg | Freq: Once | INTRAVENOUS | Status: AC
  Administered 2018-12-15: 510 mg via INTRAVENOUS
  Filled 2018-12-15: qty 17

## 2018-12-15 NOTE — Progress Notes (Signed)
PATIENT CARE CENTER NOTE  Diagnosis: Iron Deficiency Anemia    Provider: Joaquin CourtsKimberly Harris, FNP   Procedure: IV Feraheme    Note: Patient received Feraheme infusion. Interpreter service used to communicate with patient. Monitored patient for 30 minutes post-infusion. Tolerated well with no adverse reaction. Vital signs stable. Discharge instructions given to patient. Patient alert, oriented and ambulatory at discharge.

## 2018-12-15 NOTE — Discharge Instructions (Signed)

## 2018-12-15 NOTE — Progress Notes (Signed)
Orders placed for iron infusion.

## 2018-12-22 ENCOUNTER — Ambulatory Visit: Payer: Medicaid Other

## 2018-12-22 ENCOUNTER — Encounter: Payer: Self-pay | Admitting: Family Medicine

## 2018-12-22 ENCOUNTER — Ambulatory Visit (INDEPENDENT_AMBULATORY_CARE_PROVIDER_SITE_OTHER): Payer: Medicaid Other | Admitting: Family Medicine

## 2018-12-22 VITALS — BP 101/71 | HR 94 | Resp 17 | Ht 69.5 in | Wt 104.4 lb

## 2018-12-22 DIAGNOSIS — M542 Cervicalgia: Secondary | ICD-10-CM

## 2018-12-22 DIAGNOSIS — Z862 Personal history of diseases of the blood and blood-forming organs and certain disorders involving the immune mechanism: Secondary | ICD-10-CM

## 2018-12-22 DIAGNOSIS — D500 Iron deficiency anemia secondary to blood loss (chronic): Secondary | ICD-10-CM | POA: Diagnosis not present

## 2018-12-22 MED ORDER — MELOXICAM 15 MG PO TABS: 15.0000 mg | ORAL_TABLET | Freq: Every day | ORAL | 0 refills | Status: DC

## 2018-12-22 MED ORDER — DICLOFENAC SODIUM 1 % TD GEL: 2.0000 g | Freq: Four times a day (QID) | TRANSDERMAL | 1 refills | Status: DC

## 2018-12-22 NOTE — Progress Notes (Signed)
Established Patient Office Visit  Subjective:  Patient ID: Calvin Pittman, male    DOB: 08/06/1981  Age: 37 y.o. MRN: [id number]  CC:  Chief Complaint  Patient presents with  . Neck Pain    has been taking Ibuprofen with mild relief(pain resolves but then comes back)    HPI Calvin Pittman presents for evaluation of neck pain and repeat iron panel. Neck pain occurring daily. Two weeks without taking ibuprofen. Current pain level 7/10. With ibuprofen neck pain reduces to 3/10. Neck pain has remained present for over 3 years.  He reports a history of prolonged bed rest due to a GI problem while [place].  Given language barrier unable to determine whether bed rest was permanent or intermittent.  However he endorses no direct injury to his neck or fall.  Patient has been worked up extensively for generalized myalgias and had an elevated sed rate and C-reactive protein level back in October.  He was treated with a course of prednisone which primarily resolve the myalgias however he has continued to complain of neck pain and stiffness. No recent imaging of the neck.  Social History   Socioeconomic History  . Marital status: Married    Spouse name: Not on file  . Number of children: 12  . Years of education: Not on file  . Highest education level: Not on file  Occupational History  . Not on file  Social Needs  . Financial resource strain: Not on file  . Food insecurity:    Worry: Not on file    Inability: Not on file  . Transportation needs:    Medical: Not on file    Non-medical: Not on file  Tobacco Use  . Smoking status: Never Smoker  . Smokeless tobacco: Never Used  Substance and Sexual Activity  . Alcohol use: Never    Frequency: Never  . Drug use: Never  . Sexual activity: Yes    Partners: Female  Lifestyle  . Physical activity:    Days per week: Not on file    Minutes per session: Not on file  . Stress: Not on file  Relationships  . Social  connections:    Talks on phone: Not on file    Gets together: Not on file    Attends religious service: Not on file    Active member of club or organization: Not on file    Attends meetings of clubs or organizations: Not on file    Relationship status: Not on file  . Intimate partner violence:    Fear of current or ex partner: Not on file    Emotionally abused: Not on file    Physically abused: Not on file    Forced sexual activity: Not on file  Other Topics Concern  . Not on file  Social History Narrative  . Not on file    Outpatient Medications Prior to Visit  Medication Sig Dispense Refill  . cetirizine (ZYRTEC) 10 MG tablet Take 1 tablet (10 mg total) by mouth daily. 30 tablet 11  . ferrous sulfate (FERROUSUL) 325 (65 FE) MG tablet Take 1 tablet (325 mg total) by mouth 2 (two) times daily with a meal. 60 tablet 1  . omeprazole (PRILOSEC) 40 MG capsule Take 1 capsule (40 mg total) by mouth daily. 30 capsule 3  . sildenafil (VIAGRA) 25 MG tablet Take 1 tablet (25 mg total) by mouth daily as needed for erectile dysfunction (one hour prior to sexual activity). 10  tablet 0  . Vitamin D, Ergocalciferol, (DRISDOL) 50000 units CAPS capsule Take 1 capsule (50,000 Units total) by mouth every 7 (seven) days. 24 capsule 0  . acetaminophen (TYLENOL) 500 MG tablet Take 1 tablet (500 mg total) by mouth every 6 (six) hours as needed. 30 tablet 0  . benzonatate (TESSALON) 100 MG capsule Take 1-2 capsules (100-200 mg total) by mouth 3 (three) times daily as needed for cough. 40 capsule 0   No facility-administered medications prior to visit.     No Known Allergies  ROS Review of Systems Pertinent negatives listed in HPI   Objective:    Physical Exam  BP 101/71   Pulse 94   Resp 17   Ht 5' 9.5" (1.765 m)   Wt 104 lb 6.4 oz (47.4 kg)   SpO2 97%   BMI 15.20 kg/m  Wt Readings from Last 3 Encounters:  12/22/18 104 lb 6.4 oz (47.4 kg)  12/02/18 102 lb (46.3 kg)  10/27/18 110 lb 9.6 oz  (50.2 kg)   General appearance: alert, well developed, well nourished, cooperative and in no distress Head: Normocephalic, without obvious abnormality, atraumatic Neck: Active and passive ROM, stiffening noted with movement. Negative for crepitus or palpable nodule or mass Respiratory: Respirations even and unlabored, normal respiratory rate Heart: Rate and rhythm normal. Negative of murmurs or gallops. Extremities: No gross deformities Skin: Skin color, texture, turgor normal. No rashes seen  Psych: Appropriate mood and affect. Neurologic: Mental status: Alert, oriented to person, place, and time, thought content appropriate.   Health Maintenance Due  Topic Date Due  . TETANUS/TDAP  12/28/2001    There are no preventive care reminders to display for this patient.  Lab Results  Component Value Date   TSH 2.250 10/14/2018   Lab Results  Component Value Date   WBC 4.1 12/02/2018   HGB 11.1 (L) 12/02/2018   HCT 37.3 (L) 12/02/2018   MCV 70 (L) 12/02/2018   PLT 380 12/02/2018   Lab Results  Component Value Date   NA 139 10/14/2018   K 4.7 10/14/2018   CO2 26 10/14/2018   GLUCOSE 95 10/14/2018   BUN 9 10/14/2018   CREATININE 0.74 (L) 10/14/2018   BILITOT 0.2 10/14/2018   ALKPHOS 124 (H) 10/14/2018   AST 18 10/14/2018   ALT 6 10/14/2018   PROT 7.8 10/14/2018   ALBUMIN 3.5 10/14/2018   CALCIUM 9.1 10/14/2018     Assessment & Plan:  1. Iron deficiency anemia due to chronic blood loss, recent iron transfusion,  Rechecking:  - Iron, TIBC and Ferritin Panel - CBC with Differential  2. Neck pain - DG Cervical Spine Complete; Future Dg Cervical Spine Complete  Result Date: 12/22/2018 CLINICAL DATA:  Neck pain x3 weeks without known injury. EXAM: CERVICAL SPINE - COMPLETE 4+ VIEW COMPARISON:  None. FINDINGS: Maintained cervical lordosis and intact atlantodental interval. No prevertebral soft tissue swelling is identified. Disc spaces are maintained. Joint space narrowing  sclerosis of the C7-T1 facets. No significant neural foraminal encroachment. No jumped or perched facet. No acute cervical spine fracture or suspicious osseous lesions IMPRESSION: C7-T1 facet arthropathy. No acute osseous abnormality. Electronically Signed   By: Tollie Ethavid  Kwon M.D.   On: 12/22/2018 16:26   Referring to PT. - Ambulatory referral to Physical Therapy Pain management with Meloxicam and Voltaren Gel Continue vitamin D replacement.   Follow-up: 6 months or as needed    Joaquin CourtsKimberly Fidelia Cathers, FNP

## 2018-12-22 NOTE — Patient Instructions (Signed)
Cervical Radiculopathy    Cervical radiculopathy means that a nerve in the neck is pinched or bruised. This can cause pain or loss of feeling (numbness) that runs from your neck to your arm and fingers.  Follow these instructions at home:  Managing pain  · Take over-the-counter and prescription medicines only as told by your doctor.  · If directed, put ice on the injured or painful area.  ? Put ice in a plastic bag.  ? Place a towel between your skin and the bag.  ? Leave the ice on for 20 minutes, 2-3 times per day.  · If ice does not help, you can try using heat. Take a warm shower or warm bath, or use a heat pack as told by your doctor.  · You may try a gentle neck and shoulder massage.  Activity  · Rest as needed. Follow instructions from your doctor about any activities to avoid.  · Do exercises as told by your doctor or physical therapist.  General instructions  · If you were given a soft collar, wear it as told by your doctor.  · Use a flat pillow when you sleep.  · Keep all follow-up visits as told by your doctor. This is important.  Contact a doctor if:  · Your condition does not improve with treatment.  Get help right away if:  · Your pain gets worse and is not controlled with medicine.  · You lose feeling or feel weak in your hand, arm, face, or leg.  · You have a fever.  · You have a stiff neck.  · You cannot control when you poop or pee (have incontinence).  · You have trouble with walking, balance, or talking.  This information is not intended to replace advice given to you by your health care provider. Make sure you discuss any questions you have with your health care provider.  Document Released: 12/03/2011 Document Revised: 05/21/2016 Document Reviewed: 02/07/2015  Elsevier Interactive Patient Education © 2019 Elsevier Inc.

## 2018-12-23 LAB — CBC WITH DIFFERENTIAL/PLATELET
BASOS: 1 %
Basophils Absolute: 0 10*3/uL (ref 0.0–0.2)
EOS (ABSOLUTE): 0.1 10*3/uL (ref 0.0–0.4)
Eos: 3 %
Hematocrit: 39.2 % (ref 37.5–51.0)
Hemoglobin: 12.3 g/dL — ABNORMAL LOW (ref 13.0–17.7)
Immature Grans (Abs): 0 10*3/uL (ref 0.0–0.1)
Immature Granulocytes: 0 %
Lymphocytes Absolute: 0.8 10*3/uL (ref 0.7–3.1)
Lymphs: 22 %
MCH: 21.5 pg — ABNORMAL LOW (ref 26.6–33.0)
MCHC: 31.4 g/dL — ABNORMAL LOW (ref 31.5–35.7)
MCV: 69 fL — AB (ref 79–97)
Monocytes Absolute: 0.4 10*3/uL (ref 0.1–0.9)
Monocytes: 12 %
Neutrophils Absolute: 2.2 10*3/uL (ref 1.4–7.0)
Neutrophils: 62 %
Platelets: 402 10*3/uL (ref 150–450)
RBC: 5.71 x10E6/uL (ref 4.14–5.80)
RDW: 20 % — ABNORMAL HIGH (ref 12.3–15.4)
WBC: 3.6 10*3/uL (ref 3.4–10.8)

## 2018-12-23 LAB — IRON,TIBC AND FERRITIN PANEL
Ferritin: 539 ng/mL — ABNORMAL HIGH (ref 30–400)
IRON SATURATION: 15 % (ref 15–55)
Iron: 35 ug/dL — ABNORMAL LOW (ref 38–169)
Total Iron Binding Capacity: 239 ug/dL — ABNORMAL LOW (ref 250–450)
UIBC: 204 ug/dL (ref 111–343)

## 2019-01-03 ENCOUNTER — Ambulatory Visit: Payer: Medicaid Other | Attending: Family Medicine | Admitting: Physical Therapy

## 2019-01-03 ENCOUNTER — Encounter: Payer: Self-pay | Admitting: Physical Therapy

## 2019-01-03 DIAGNOSIS — M542 Cervicalgia: Secondary | ICD-10-CM | POA: Insufficient documentation

## 2019-01-03 DIAGNOSIS — R293 Abnormal posture: Secondary | ICD-10-CM | POA: Diagnosis present

## 2019-01-03 NOTE — Therapy (Addendum)
[facility] [address] [place], [place], [address] Phone: [phone]   Fax:  [phone]  Physical Therapy Evaluation  Patient Details  Name: Calvin Pittman MRN: [id number] Date of Birth: 06-23-1981 Referring Provider (PT): Bing Neighbors, FNP   Encounter Date: 01/03/2019  PT End of Session - 01/03/19 1255    Visit Number  1    Number of Visits  13    Date for PT Re-Evaluation  02/28/19    Authorization Type  MCD: resubmit at 4th visit    PT Start Time  1325    PT Stop Time  1407    PT Time Calculation (min)  42 min    Activity Tolerance  Patient tolerated treatment well    Behavior During Therapy  [facility] for tasks assessed/performed       History reviewed. No pertinent past medical history.  History reviewed. No pertinent surgical history.  There were no vitals filed for this visit.   Subjective Assessment - 01/03/19 1339    Subjective  pt is a 38 y.o M reporting neck pain in the back and to the L started with no specific onset beginning over 5 years ago. pt reports some occasional pain to the L shoulder but not frequently. He reports pain fluctuates depending activity and stays mostly between shoulders.     How long can you sit comfortably?  unable to report    How long can you stand comfortably?  unable to report    How long can you walk comfortably?  unable to report    Diagnostic tests  x-ray  12/22/2018: impression C7-T1 facet arthropathy. No acute osseous abnormality    Patient Stated Goals  to get to feeling better    Currently in Pain?  Yes    Pain Score  5    at worst 8/10   Pain Location  Neck    Pain Orientation  Right;Left;Mid    Pain Descriptors / Indicators  Aching;Sore    Pain Type  Chronic pain    Pain Frequency  Constant    Aggravating Factors   getting out of bed the pain is worse    Pain Relieving Factors  laying down and sleeping    Effect of Pain on Daily Activities  pain with sleeping          [facility] PT Assessment - 01/03/19 1258      Assessment   Medical Diagnosis  Neck pain     Referring Provider (PT)  Bing Neighbors, FNP    Onset Date/Surgical Date  --   5 years ago   Hand Dominance  Right    Next MD Visit  unsure of next appointment    Prior Therapy  no      Precautions   Precautions  None      Restrictions   Weight Bearing Restrictions  No      Balance Screen   Has the patient fallen in the past 6 months  No    Has the patient had a decrease in activity level because of a fear of falling?   No    Is the patient reluctant to leave their home because of a fear of falling?   No      Home Environment   Living Environment  Private residence    Living Arrangements  Spouse/significant other;Children    Available Help at Discharge  Family;Available PRN/intermittently    Type of Home  House  Prior Function   Level of Independence  Independent    Vocation  Unemployed      Cognition   Overall Cognitive Status  Within Functional Limits for tasks assessed      Posture/Postural Control   Posture/Postural Control  Postural limitations    Postural Limitations  Rounded Shoulders;Forward head      ROM / Strength   AROM / PROM / Strength  AROM;Strength      AROM   AROM Assessment Site  Cervical    Cervical Flexion  20   ERP noting condordant symptoms   Cervical Extension  30   reproducted of condordant pain at end range   Cervical - Right Side Bend  20    Cervical - Left Side Bend  20    Cervical - Right Rotation  30    Cervical - Left Rotation  40      Palpation   Palpation comment  TTP along bil rhomboids and thoracic paraspinals with L>R. suboicciptal and upper trap trigger points noted as well                Objective measurements completed on examination: See above findings.              PT Education - 01/03/19 1302    Education Details  evaluation findings, POC, goals, HEP with proper form/ rationale.      Person(s) Educated  Patient    Methods  Explanation;Verbal cues;Handout    Comprehension  Verbalized understanding;Verbal cues required       PT Short Term Goals - 01/03/19 1304      PT SHORT TERM GOAL #1   Title  pt to be I with inital HEP    Baseline  no previous HEP    Time  3    Period  Weeks    Status  New    Target Date  01/24/19      PT SHORT TERM GOAL #2   Title  pt to verbalize/ demo proper posture and lifting mechanics to reduce and prevent low back pain     Baseline  no knowledge of proper posture and lifting mechanics    Time  3    Period  Weeks    Status  New    Target Date  01/24/19        PT Long Term Goals - 01/03/19 1306      PT LONG TERM GOAL #1   Title  increase cervical mobility to >/= 10 degrees in all planes to promote funtional cervical mobility with </= 2/10 pain     Baseline  flexion 20 degrees, extension 30 degrees, bil sidebending 20 degrees , R rotation 30, L rotation 40 degrees    Time  6    Period  Weeks    Status  New    Target Date  02/28/19      PT LONG TERM GOAL #2   Title  decrease muscle spasm in bil Rhomboids and upper trap to reduce pain and promote functional mobility    Baseline  spasm noted in bil rhomboids and upper trap     Time  6    Period  Weeks    Status  New    Target Date  02/28/19      PT LONG TERM GOAL #3   Title  pt to report no pain at night and getting out of bed in the morning for QOL    Baseline  pain at  6-8/10 when getting out of bed    Time  6    Period  Weeks    Status  New    Target Date  02/28/19      PT LONG TERM GOAL #4   Title  to be I with all HEP given as of last visit maintain and progress current level of function .     Baseline  no previous HEP     Time  6    Period  Weeks    Status  New    Target Date  02/28/19             Plan - 01/03/19 1310    Clinical Impression Statement  pt presents to OPPT with CC of neck and upper thoracic pain with no specific onset starting over 5  years ago. he demonstrates mild limitation in cerivcal mobility with report of condordant pain noted at end ranges. TTP along bil upper trap and thoracic paraspinals with L>R and mutliple trigger points noted. He would benefit from physical therapy to decrease neck and thoracic pain, promote proper posture and maximize funciton by addressing the deficits listed.     History and Personal Factors relevant to plan of care:  pt speaks primarily [place] and limited english    Clinical Presentation  Evolving    Clinical Presentation due to:  abnormal posture. muscle spasm, neck and thoracic pain, abnormal posture    Rehab Potential  Good    PT Frequency  2x / week    PT Duration  6 weeks   inital MCD auth 1 x week for 3 weeks.    PT Treatment/Interventions  ADLs/Self Care Home Management;Cryotherapy;Electrical Stimulation;Iontophoresis 4mg /ml Dexamethasone;Moist Heat;Traction;Therapeutic exercise;Therapeutic activities;Manual techniques;Dry needling;Patient/family education;Taping;Balance training;Passive range of motion;Ultrasound    PT Next Visit Plan  review/ update HEP PRN, assess shoulder strength, upper trap and surrounding musculature stretching, posture education,     PT Home Exercise Plan  chin tucks, thoracic rotation, upper trap stretch, rhomboid stretch    Consulted and Agree with Plan of Care  Patient       Patient will benefit from skilled therapeutic intervention in order to improve the following deficits and impairments:  Pain, Increased fascial restricitons, Increased muscle spasms, Decreased endurance, Decreased activity tolerance, Postural dysfunction, Improper body mechanics, Decreased range of motion  Visit Diagnosis: Cervicalgia - Plan: PT plan of care cert/re-cert  Abnormal posture - Plan: PT plan of care cert/re-cert     Problem List Patient Active Problem List   Diagnosis Date Noted  . Erectile dysfunction 10/27/2018  . Pain of right lower extremity 10/27/2018    Lulu Riding PT, DPT, LAT, ATC  01/03/19  2:19 PM      [facility] [address] [place], [place], [address] Phone: [phone]   Fax:  [phone]  Name: Calvin Pittman MRN: [id number] Date of Birth: 12-10-1981

## 2019-01-12 ENCOUNTER — Ambulatory Visit: Payer: Medicaid Other | Admitting: Physical Therapy

## 2019-01-12 ENCOUNTER — Encounter: Payer: Self-pay | Admitting: Physical Therapy

## 2019-01-12 DIAGNOSIS — M542 Cervicalgia: Secondary | ICD-10-CM

## 2019-01-12 DIAGNOSIS — R293 Abnormal posture: Secondary | ICD-10-CM

## 2019-01-12 NOTE — Therapy (Signed)
[facility] [address] [place], [place], [address] Phone: [phone]   Fax:  [phone]  Physical Therapy Treatment  Patient Details  Name: Calvin Pittman MRN: [id number] Date of Birth: 11/12/1981 Referring Provider (PT): Bing NeighborsHarris, Kimberly S, FNP   Encounter Date: 01/12/2019  PT End of Session - 01/12/19 0958    Visit Number  2    Number of Visits  13    Date for PT Re-Evaluation  02/28/19    Authorization Type  MCD: resubmit at 4th visit    Authorization Time Period  initial auth 01/11/2019 - 01/31/2019    Authorization - Visit Number  1    Authorization - Number of Visits  3    PT Start Time  1013    PT Stop Time  1105    PT Time Calculation (min)  52 min    Activity Tolerance  Patient tolerated treatment well    Behavior During Therapy  [facility]       History reviewed. No pertinent past medical history.  History reviewed. No pertinent surgical history.  There were no vitals filed for this visit.  Subjective Assessment - 01/12/19 1017    Subjective  "I did the exercises but I did cause my back to be sore so I didn't do it"     Patient Stated Goals  to get to feeling better    Currently in Pain?  Yes    Pain Score  7     Pain Orientation  Right;Left;Mid    Pain Descriptors / Indicators  Aching;Sore    Pain Type  Chronic pain    Aggravating Factors   getting out of bed, any activity     Pain Relieving Factors  laying down and sleeping                       [facility] Adult PT Treatment/Exercise - 01/12/19 1011      Self-Care   Self-Care  Other Self-Care Comments    Other Self-Care Comments   how to perform MTPR along the peri-scapular and thoracic region using tennis ball      Neck Exercises: Seated   Other Seated Exercise  thoracic rotation 1 x 10 bil holding end range x 3 seconds      Neck Exercises: Supine   Neck Retraction  10 reps;5 secs      Lumbar Exercises:  Supine   Other Supine Lumbar Exercises  hooklying pressing with bil UE against black ball 2 x 10 holding 1      Shoulder Exercises: Supine   Other Supine Exercises  scapular retraction with ER bil 2 x 12 with yellow band      Modalities   Modalities  Cryotherapy      Cryotherapy   Number Minutes Cryotherapy  10 Minutes    Cryotherapy Location  Shoulder   thoracic spine   Type of Cryotherapy  Ice pack      Manual Therapy   Manual therapy comments  MTPR along bil upper trap/ levato and splenus capitis bil      Neck Exercises: Stretches   Upper Trapezius Stretch  2 reps;30 seconds;Right;Left             PT Education - 01/12/19 1056    Education Details  reviewed previusly provided HEP and importnace of continued strengthening gradually.        PT Short Term Goals - 01/03/19 1304  PT SHORT TERM GOAL #1   Title  pt to be I with inital HEP    Baseline  no previous HEP    Time  3    Period  Weeks    Status  New    Target Date  01/24/19      PT SHORT TERM GOAL #2   Title  pt to verbalize/ demo proper posture and lifting mechanics to reduce and prevent low back pain     Baseline  no knowledge of proper posture and lifting mechanics    Time  3    Period  Weeks    Status  New    Target Date  01/24/19        PT Long Term Goals - 01/03/19 1306      PT LONG TERM GOAL #1   Title  increase cervical mobility to >/= 10 degrees in all planes to promote funtional cervical mobility with </= 2/10 pain     Baseline  flexion 20 degrees, extension 30 degrees, bil sidebending 20 degrees , R rotation 30, L rotation 40 degrees    Time  6    Period  Weeks    Status  New    Target Date  02/28/19      PT LONG TERM GOAL #2   Title  decrease muscle spasm in bil Rhomboids and upper trap to reduce pain and promote functional mobility    Baseline  spasm noted in bil rhomboids and upper trap     Time  6    Period  Weeks    Status  New    Target Date  02/28/19      PT LONG TERM  GOAL #3   Title  pt to report no pain at night and getting out of bed in the morning for QOL    Baseline  pain at 6-8/10 when getting out of bed    Time  6    Period  Weeks    Status  New    Target Date  02/28/19      PT LONG TERM GOAL #4   Title  to be I with all HEP given as of last visit maintain and progress current level of function .     Baseline  no previous HEP     Time  6    Period  Weeks    Status  New    Target Date  02/28/19            Plan - 01/12/19 1044    Clinical Impression Statement  pt reports increased soreness today because he isn't on medication for pain. reviewed previous provided HEp to promote good form and discussed benefits. worked on shoulder strengthening and core activation following STW to relieve tension in the neck and shoulders. pt required frequenct verbal and tactile cues throughout session for proper form     PT Treatment/Interventions  ADLs/Self Care Home Management;Cryotherapy;Electrical Stimulation;Iontophoresis 4mg /ml Dexamethasone;Moist Heat;Traction;Therapeutic exercise;Therapeutic activities;Manual techniques;Dry needling;Patient/family education;Taping;Balance training;Passive range of motion;Ultrasound    PT Next Visit Plan  update HEP PRN, assess shoulder strength, upper trap and surrounding musculature stretching, posture education,     PT Home Exercise Plan  chin tucks, thoracic rotation, upper trap stretch, rhomboid stretch    Consulted and Agree with Plan of Care  Patient       Patient will benefit from skilled therapeutic intervention in order to improve the following deficits and impairments:  Pain, Increased fascial restricitons, Increased muscle  spasms, Decreased endurance, Decreased activity tolerance, Postural dysfunction, Improper body mechanics, Decreased range of motion  Visit Diagnosis: Cervicalgia  Abnormal posture     Problem List Patient Active Problem List   Diagnosis Date Noted  . Erectile dysfunction  10/27/2018  . Pain of right lower extremity 10/27/2018    Lulu RidingLeamon, Mariyah Upshaw 01/12/2019, 11:00 AM  [facility] [address] [place], [place], [address] Phone: [phone]   Fax:  [phone]  Name: Calvin Coffinnnocent Vandenbos MRN: [id number] Date of Birth: 12/28/1981

## 2019-01-19 ENCOUNTER — Ambulatory Visit: Payer: Medicaid Other | Admitting: Physical Therapy

## 2019-01-19 DIAGNOSIS — R293 Abnormal posture: Secondary | ICD-10-CM

## 2019-01-19 DIAGNOSIS — M542 Cervicalgia: Secondary | ICD-10-CM

## 2019-01-19 NOTE — Patient Instructions (Signed)
Side Pull: Double Arm   On back, knees bent, feet flat. Arms perpendicular to body, shoulder level, elbows straight but relaxed. Pull arms out to sides, elbows straight. Resistance band comes across collarbones, hands toward floor. Hold momentarily. Slowly return to starting position. Repeat _10_x2 _ times. Band color __Y___    Shoulder Rotation: Double Arm   On back, knees bent, feet flat, elbows tucked at sides, bent 90, hands palms up. Pull hands apart and down toward floor, keeping elbows near sides. Hold momentarily. Slowly return to starting position. Repeat _10 x2 __ times. Band color __Y____

## 2019-01-19 NOTE — Therapy (Signed)
[facility] [address] [place], [place], [address] Phone: [phone]   Fax:  [phone]  Physical Therapy Treatment  Patient Details  Name: Calvin Pittman MRN: [id number] Date of Birth: 1981/06/06 Referring Provider (PT): Bing Neighbors, FNP   Encounter Date: 01/19/2019  PT End of Session - 01/19/19 1102    Visit Number  3    Number of Visits  13    Date for PT Re-Evaluation  02/28/19    Authorization Type  MCD: resubmit at 4th visit    Authorization - Visit Number  2    Authorization - Number of Visits  3    PT Start Time  1100    PT Stop Time  1145    PT Time Calculation (min)  45 min       No past medical history on file.  No past surgical history on file.  There were no vitals filed for this visit.  Subjective Assessment - 01/19/19 1111    Subjective  I tried the exercises. I did not have a band     Patient is accompained by:  Interpreter   Calvin Pittman   Currently in Pain?  Yes    Pain Score  6    with movement    Pain Location  Neck    Pain Orientation  Right;Left    Pain Descriptors / Indicators  Tightness    Aggravating Factors   movement    Pain Relieving Factors  rest                       [facility] Adult PT Treatment/Exercise - 01/19/19 0001      Neck Exercises: Seated   Neck Retraction  10 reps;5 secs      Neck Exercises: Supine   Neck Retraction  10 reps;5 secs    Neck Retraction Limitations  attempted head lift       Shoulder Exercises: Supine   Other Supine Exercises  scapular retraction with ER bil 2 x 12 with yellow band      Manual Therapy   Manual therapy comments  MTPR to upper trap, STW to cerval paraspinals SCM.       Neck Exercises: Stretches   Upper Trapezius Stretch  2 reps;30 seconds;Right;Left    Levator Stretch  --    Other Neck Stretches  Neck AROM all planes              PT Education - 01/19/19 1131    Education Details  HEP    Person(s)  Educated  Patient    Methods  Explanation;Handout    Comprehension  Verbalized understanding       PT Short Term Goals - 01/03/19 1304      PT SHORT TERM GOAL #1   Title  pt to be I with inital HEP    Baseline  no previous HEP    Time  3    Period  Weeks    Status  New    Target Date  01/24/19      PT SHORT TERM GOAL #2   Title  pt to verbalize/ demo proper posture and lifting mechanics to reduce and prevent low back pain     Baseline  no knowledge of proper posture and lifting mechanics    Time  3    Period  Weeks    Status  New    Target Date  01/24/19  PT Long Term Goals - 01/03/19 1306      PT LONG TERM GOAL #1   Title  increase cervical mobility to >/= 10 degrees in all planes to promote funtional cervical mobility with </= 2/10 pain     Baseline  flexion 20 degrees, extension 30 degrees, bil sidebending 20 degrees , R rotation 30, L rotation 40 degrees    Time  6    Period  Weeks    Status  New    Target Date  02/28/19      PT LONG TERM GOAL #2   Title  decrease muscle spasm in bil Rhomboids and upper trap to reduce pain and promote functional mobility    Baseline  spasm noted in bil rhomboids and upper trap     Time  6    Period  Weeks    Status  New    Target Date  02/28/19      PT LONG TERM GOAL #3   Title  pt to report no pain at night and getting out of bed in the morning for QOL    Baseline  pain at 6-8/10 when getting out of bed    Time  6    Period  Weeks    Status  New    Target Date  02/28/19      PT LONG TERM GOAL #4   Title  to be I with all HEP given as of last visit maintain and progress current level of function .     Baseline  no previous HEP     Time  6    Period  Weeks    Status  New    Target Date  02/28/19            Plan - 01/19/19 1144    Clinical Impression Statement  Pt reports pain with movement at 6/10 with limited ROM. Unable to lie down without assisting his head with his hands. Very difficult with head lift  off pillow. Focused manul for pain and tension in cervical muscles and upper traps. Progressed HEP to supine yellow bands, Pain reduced to 4-5/10 after treatment.     PT Next Visit Plan  update HEP PRN, assess shoulder strength, upper trap and surrounding musculature stretching, posture education,     PT Home Exercise Plan  chin tucks, thoracic rotation, upper trap stretch, rhomboid stretch, supine yellow band Er     Consulted and Agree with Plan of Care  Patient       Patient will benefit from skilled therapeutic intervention in order to improve the following deficits and impairments:  Pain, Increased fascial restricitons, Increased muscle spasms, Decreased endurance, Decreased activity tolerance, Postural dysfunction, Improper body mechanics, Decreased range of motion  Visit Diagnosis: Cervicalgia  Abnormal posture     Problem List Patient Active Problem List   Diagnosis Date Noted  . Erectile dysfunction 10/27/2018  . Pain of right lower extremity 10/27/2018    Sherrie Mustache, [place] 01/19/2019, 12:17 PM  [facility] [address] [place], [place], [address] Phone: [phone]   Fax:  [phone]  Name: Calvin Pittman MRN: [id number] Date of Birth: 09-01-1981

## 2019-01-26 ENCOUNTER — Ambulatory Visit: Payer: Medicaid Other | Admitting: Physical Therapy

## 2019-01-26 ENCOUNTER — Encounter: Payer: Self-pay | Admitting: Physical Therapy

## 2019-01-26 DIAGNOSIS — R293 Abnormal posture: Secondary | ICD-10-CM

## 2019-01-26 DIAGNOSIS — M542 Cervicalgia: Secondary | ICD-10-CM

## 2019-01-26 NOTE — Therapy (Signed)
[place], [place], [address] Phone: [phone]   Fax:  [phone]  Physical Therapy Treatment  Patient Details  Name: Calvin Pittman MRN: [id number] Date of Birth: 06/15/1981 Referring Provider (PT): Calvin Jun, FNP   Encounter Date: 01/26/2019  PT End of Session - 01/26/19 1425    Visit Number  4    Number of Visits  13    Date for PT Re-Evaluation  02/28/19    Authorization Type  MCD: resubmit at 4th visit    Authorization Time Period  initial auth 01/11/2019 - 01/31/2019 (resubmitted on 01/26/2019)    Authorization - Visit Number  3    Authorization - Number of Visits  3    PT Start Time  4332    PT Stop Time  1501    PT Time Calculation (min)  43 min    Activity Tolerance  Patient tolerated treatment well    Behavior During Therapy  [facility] for tasks assessed/performed       History reviewed. No pertinent past medical history.  History reviewed. No pertinent surgical history.  There were no vitals filed for this visit.  Subjective Assessment - 01/26/19 1422    Subjective  "I am about 50/50, I have decreased pain but the neck is sore in the front. the pain is mostly on the R side of the neck"    Patient Stated Goals  to get to feeling better    Currently in Pain?  Yes    Pain Score  5     Pain Location  Neck    Pain Orientation  Right;Left    Pain Descriptors / Indicators  Aching;Sore    Pain Type  Chronic pain    Aggravating Factors   turning to the R    Pain Relieving Factors  rest         [facility] PT Assessment - 01/26/19 1426      Assessment   Medical Diagnosis  Neck pain     Referring Provider (PT)  Calvin Jun, FNP      AROM   Cervical Flexion  19   pain at end range in the back of the shoulder   Cervical Extension  29    Cervical - Right Side Bend  28    Cervical - Left Side Bend  13    Cervical - Right Rotation  31    Cervical - Left Rotation  38                    [facility] Adult PT Treatment/Exercise - 01/26/19 0001      Lumbar Exercises: Supine   Other Supine Lumbar Exercises  hooklying pos slideing hands to touch heels 2 x 10   pt fatigued quickly during activity     Shoulder Exercises: Supine   Protraction  12 reps;Weights;Both;Strengthening   3#   Diagonals  Strengthening;Both;15 reps   D1 2 x 15,   Theraband Level (Shoulder Diagonals)  Level 2 (Red);Level 1 (Yellow)    Diagonals Limitations  tactile cues for proper form     Other Supine Exercises  scapular retraction with ER bil 2 x 15 with red band    Other Supine Exercises  lower trap strengthening with yellow theraband      Manual Therapy   Manual Therapy  Joint mobilization    Manual therapy comments  MTPR to R upper trap, L SCM     Joint Mobilization  R first rib mobs grade III with pt breathing in and out for add MWM      Neck Exercises: Stretches   Upper Trapezius Stretch  1 rep;30 seconds             PT Education - 01/26/19 1458    Education Details  reviewed/ updated HEP for proper form    Person(s) Educated  Patient    Methods  Explanation;Verbal cues;Handout    Comprehension  Verbalized understanding;Verbal cues required       PT Short Term Goals - 01/26/19 1508      PT SHORT TERM GOAL #1   Title  pt to be I with inital HEP    Time  3    Status  Achieved      PT SHORT TERM GOAL #2   Title  pt to verbalize/ demo proper posture and lifting mechanics to reduce and prevent low back pain     Time  3    Period  Weeks    Status  Achieved        PT Long Term Goals - 01/26/19 1508      PT LONG TERM GOAL #1   Title  increase cervical mobility to >/= 10 degrees in all planes to promote funtional cervical mobility with </= 2/10 pain     Baseline  flexion 19 degrees, extension 29 degrees, bil R sidebend 28, L 13 , R rotation 31, L rotation 38 degrees    Time  6    Period  Weeks    Status  On-going    Target Date  02/28/19      PT  LONG TERM GOAL #2   Title  decrease muscle spasm in bil Rhomboids and upper trap to reduce pain and promote functional mobility    Baseline  continued spasm in bil Rhomboid as well as upper trap R>L    Time  6    Period  Weeks    Status  On-going    Target Date  02/28/19      PT LONG TERM GOAL #3   Title  pt to report no pain at night and getting out of bed in the morning for QOL    Baseline  pain at 6-8/10 when getting out of bed    Time  6    Period  Weeks    Status  On-going    Target Date  02/28/19      PT LONG TERM GOAL #4   Title  to be I with all HEP given as of last visit maintain and progress current level of function .     Baseline  independent with current HEP and progressing as able.     Time  6    Period  Weeks    Status  On-going    Target Date  02/28/19            Plan - 01/26/19 1514    Clinical Impression Statement  Calvin Pittman is making progress with decreased pain inthe back and is maintaining cervical ROM. He continues to report pain at 4-5/10 located in the anteriolateral aspect of bil sides of the neck. He met all STG's today and is working toward the LTG;s and is highly motivated to continue with strengthening and improving function. focused on shoulder strengthening and manual techniques to calm down muscle tension. plan to continue with neck and shoulder strengthening, promote good posture and work toward remaining goals and independent exercise.  PT Frequency  2x / week    PT Duration  6 weeks    PT Treatment/Interventions  ADLs/Self Care Home Management;Cryotherapy;Electrical Stimulation;Iontophoresis 49m/ml Dexamethasone;Moist Heat;Traction;Therapeutic exercise;Therapeutic activities;Manual techniques;Dry needling;Patient/family education;Taping;Balance training;Passive range of motion;Ultrasound    PT Next Visit Plan  update HEP PRN, assess shoulder strength, upper trap and surrounding musculature stretching, posture education,     PT Home Exercise  Plan  chin tucks, thoracic rotation, upper trap stretch, rhomboid stretch, supine yellow band Er     Consulted and Agree with Plan of Care  Patient       Patient will benefit from skilled therapeutic intervention in order to improve the following deficits and impairments:  Pain, Increased fascial restricitons, Increased muscle spasms, Decreased endurance, Decreased activity tolerance, Postural dysfunction, Improper body mechanics, Decreased range of motion  Visit Diagnosis: Cervicalgia  Abnormal posture     Problem List Patient Active Problem List   Diagnosis Date Noted  . Erectile dysfunction 10/27/2018  . Pain of right lower extremity 10/27/2018   KStarr LakePT, DPT, LAT, ATC  01/26/19  3:25 PM      [facility] [address]Phone: [phone]  Fax:  [phone] Name: ITrevan MessmanMRN: [id number] of Birth: 120-Apr-1982

## 2019-02-06 ENCOUNTER — Ambulatory Visit: Payer: Medicaid Other | Attending: Family Medicine | Admitting: Physical Therapy

## 2019-02-06 ENCOUNTER — Telehealth: Payer: Self-pay | Admitting: Physical Therapy

## 2019-02-06 DIAGNOSIS — M542 Cervicalgia: Secondary | ICD-10-CM | POA: Insufficient documentation

## 2019-02-06 DIAGNOSIS — R293 Abnormal posture: Secondary | ICD-10-CM | POA: Insufficient documentation

## 2019-02-06 NOTE — Telephone Encounter (Addendum)
Interpreter attempted to call patient regarding no-show to appointment. Unable to leave message.   Patient returned call at 11:19 am and stated he could not attend today.

## 2019-02-07 ENCOUNTER — Ambulatory Visit (INDEPENDENT_AMBULATORY_CARE_PROVIDER_SITE_OTHER): Payer: Medicaid Other | Admitting: Family Medicine

## 2019-02-07 ENCOUNTER — Encounter: Payer: Self-pay | Admitting: Family Medicine

## 2019-02-07 VITALS — BP 119/66 | HR 95 | Temp 98.4°F | Resp 17 | Ht 69.5 in | Wt 100.6 lb

## 2019-02-07 DIAGNOSIS — M79605 Pain in left leg: Secondary | ICD-10-CM

## 2019-02-07 DIAGNOSIS — M79604 Pain in right leg: Secondary | ICD-10-CM

## 2019-02-07 DIAGNOSIS — R0600 Dyspnea, unspecified: Secondary | ICD-10-CM

## 2019-02-07 DIAGNOSIS — M542 Cervicalgia: Secondary | ICD-10-CM

## 2019-02-07 DIAGNOSIS — G8929 Other chronic pain: Secondary | ICD-10-CM

## 2019-02-07 DIAGNOSIS — M7918 Myalgia, other site: Secondary | ICD-10-CM | POA: Diagnosis not present

## 2019-02-07 DIAGNOSIS — R64 Cachexia: Secondary | ICD-10-CM | POA: Insufficient documentation

## 2019-02-07 MED ORDER — ALBUTEROL SULFATE HFA 108 (90 BASE) MCG/ACT IN AERS: 2.0000 | INHALATION_SPRAY | Freq: Four times a day (QID) | RESPIRATORY_TRACT | 0 refills | Status: DC | PRN

## 2019-02-07 NOTE — Progress Notes (Deleted)
Established Patient Office Visit  Subjective:  Patient ID: Calvin Pittman, male    DOB: [birth year]  Age: 38 y.o. MRN: [id number]  CC:  Chief Complaint  Patient presents with  . Leg Pain    HPI Calvin Pittman presents today for complaint of bilateral leg and hip pain. He has history of chronic musculoskeletal pain. He endorses that the pain is greater on the right side and describes it as a "tight". He [facility] is [facility] [address] [facility]. He endorses that with physical therapy he has achieved some minor relief in neck and back pain and gained increased mobility. He endorses that he has had minimal pain relief with the previously prescribed mobic and voltaren gel. He denies chest tightness, and palpitations. He endorses upper chest pain with coughing. He endorses some shortness of breath when walking a distance of one kilometer or greater.   No past medical history on file.  No past surgical history on file.  No family history on file.  Social History   Socioeconomic History  . Marital status: Married    Spouse name: Not on file  . Number of children: 12  . Years of education: Not on file  . Highest education level: Not on file  Occupational History  . Not on file  Social Needs  . Financial resource strain: Not on file  . Food insecurity:    Worry: Not on file    Inability: Not on file  . Transportation needs:    Medical: Not on file    Non-medical: Not on file  Tobacco Use  . Smoking status: Never Smoker  . Smokeless tobacco: Never Used  Substance and Sexual Activity  . Alcohol use: Never    Frequency: Never  . Drug use: Never  . Sexual activity: Yes    Partners: Female  Lifestyle  . Physical activity:    Days per week: Not on file    Minutes per session: Not on file  . Stress: Not on file  Relationships  . Social connections:    Talks on phone: Not on file    Gets together: Not on file    Attends  religious service: Not on file    Active member of club or organization: Not on file    Attends meetings of clubs or organizations: Not on file    Relationship status: Not on file  . Intimate partner violence:    Fear of current or ex partner: Not on file    Emotionally abused: Not on file    Physically abused: Not on file    Forced sexual activity: Not on file  Other Topics Concern  . Not on file  Social History Narrative  . Not on file    Outpatient Medications Prior to Visit  Medication Sig Dispense Refill  . cetirizine (ZYRTEC) 10 MG tablet Take 1 tablet (10 mg total) by mouth daily. 30 tablet 11  . diclofenac sodium (VOLTAREN) 1 % GEL Apply 2 g topically 4 (four) times daily. 100 g 1  . ferrous sulfate (FERROUSUL) 325 (65 FE) MG tablet Take 1 tablet (325 mg total) by mouth 2 (two) times daily with a meal. 60 tablet 1  . meloxicam (MOBIC) 15 MG tablet Take 1 tablet (15 mg total) by mouth daily. 30 tablet 0  . omeprazole (PRILOSEC) 40 MG capsule Take 1 capsule (40 mg total) by mouth daily. 30 capsule 3  . sildenafil (VIAGRA) 25 MG tablet  Take 1 tablet (25 mg total) by mouth daily as needed for erectile dysfunction (one hour prior to sexual activity). 10 tablet 0  . Vitamin D, Ergocalciferol, (DRISDOL) 50000 units CAPS capsule Take 1 capsule (50,000 Units total) by mouth every 7 (seven) days. 24 capsule 0   No facility-administered medications prior to visit.     No Known Allergies  ROS Review of Systems  Constitutional: Negative for appetite change, fatigue and fever.  Respiratory: Positive for shortness of breath. Negative for cough, chest tightness and wheezing.        Shortness of breath with walking distances of one kilometer  Cardiovascular: Negative for chest pain and palpitations.  Gastrointestinal: Positive for abdominal pain. Negative for blood in stool, constipation, nausea and vomiting.       Endorses burning pain  Musculoskeletal: Positive for arthralgias, back  pain and neck stiffness.  Neurological: Negative for dizziness, syncope, light-headedness and headaches.      Objective:    Physical Exam  Constitutional: He is oriented to person, place, and time. He appears cachectic. He is cooperative.  Neck: Spinous process tenderness and muscular tenderness present.  Cardiovascular: Normal rate, regular rhythm and normal heart sounds.  Pulmonary/Chest: Effort normal and breath sounds normal.  Abdominal: There is no splenomegaly or hepatomegaly. There is abdominal tenderness in the left upper quadrant and left lower quadrant. There is no tenderness at McBurney's point and negative Murphy's sign.  Musculoskeletal:     Right shoulder: He exhibits tenderness.     Left shoulder: He exhibits tenderness.     Right hip: He exhibits decreased range of motion and tenderness.  Neurological: He is alert and oriented to person, place, and time.  Psychiatric: He has a normal mood and affect. His behavior is normal. Judgment and thought content normal.    BP 119/66   Pulse 95   Temp 98.4 F (36.9 C)   Resp 17   Ht 5' 9.5" (1.765 m)   Wt 100 lb 9.6 oz (45.6 kg)   SpO2 97%   BMI 14.64 kg/m  Wt Readings from Last 3 Encounters:  02/07/19 100 lb 9.6 oz (45.6 kg)  12/22/18 104 lb 6.4 oz (47.4 kg)  12/02/18 102 lb (46.3 kg)     Health Maintenance Due  Topic Date Due  . TETANUS/TDAP  12/28/2001    There are no preventive care reminders to display for this patient.  Lab Results  Component Value Date   TSH 2.250 10/14/2018   Lab Results  Component Value Date   WBC 3.6 12/22/2018   HGB 12.3 (L) 12/22/2018   HCT 39.2 12/22/2018   MCV 69 (L) 12/22/2018   PLT 402 12/22/2018   Lab Results  Component Value Date   NA 139 10/14/2018   K 4.7 10/14/2018   CO2 26 10/14/2018   GLUCOSE 95 10/14/2018   BUN 9 10/14/2018   CREATININE 0.74 (L) 10/14/2018   BILITOT 0.2 10/14/2018   ALKPHOS 124 (H) 10/14/2018   AST 18 10/14/2018   ALT 6 10/14/2018    PROT 7.8 10/14/2018   ALBUMIN 3.5 10/14/2018   CALCIUM 9.1 10/14/2018   No results found for: CHOL No results found for: HDL No results found for: LDLCALC No results found for: TRIG No results found for: CHOLHDL No results found for: [id number]    Assessment & Plan:   Problem List Items Addressed This Visit      Other   Chronic musculoskeletal pain   Cachexia (HCC)  Other Visit Diagnoses    Shortness of breath    -  Primary   Neck pain       Generalized body aches       Low grade fever          1. Shortness of breath Albuterol inhaler PRN for shortness of breath  2. Chronic musculoskeletal pain - Ambulatory referral to Pain Clinic - Advised to take tylenol over the counter for pain as needed  3. Cachexia (HCC) Encouraged to increase nutrition, Given ensure to help promote caloric intake.  4. Neck pain - Ambulatory referral to Pain Clinic  5. Generalized body aches - Ambulatory referral to Pain Clinic    Follow-up: No follow-ups on file.  A total of 20 minutes spent, greater than 50 % of this time was spent assessing, counseling and coordination of care.    Vincent Gros, RN

## 2019-02-07 NOTE — Progress Notes (Deleted)
albuter  Established Patient Office Visit  Subjective:  Patient ID: Calvin Pittman, male    DOB: 12/13/1981  Age: 38 y.o. MRN: [id number]  CC:  Chief Complaint  Patient presents with  . Leg Pain    HPI Calvin Pittman presents for generalized musculoskeletal pain   No past medical history on file.  No past surgical history on file.  No family history on file.  Social History   Socioeconomic History  . Marital status: Married    Spouse name: Not on file  . Number of children: 12  . Years of education: Not on file  . Highest education level: Not on file  Occupational History  . Not on file  Social Needs  . Financial resource strain: Not on file  . Food insecurity:    Worry: Not on file    Inability: Not on file  . Transportation needs:    Medical: Not on file    Non-medical: Not on file  Tobacco Use  . Smoking status: Never Smoker  . Smokeless tobacco: Never Used  Substance and Sexual Activity  . Alcohol use: Never    Frequency: Never  . Drug use: Never  . Sexual activity: Yes    Partners: Female  Lifestyle  . Physical activity:    Days per week: Not on file    Minutes per session: Not on file  . Stress: Not on file  Relationships  . Social connections:    Talks on phone: Not on file    Gets together: Not on file    Attends religious service: Not on file    Active member of club or organization: Not on file    Attends meetings of clubs or organizations: Not on file    Relationship status: Not on file  . Intimate partner violence:    Fear of current or ex partner: Not on file    Emotionally abused: Not on file    Physically abused: Not on file    Forced sexual activity: Not on file  Other Topics Concern  . Not on file  Social History Narrative  . Not on file    Outpatient Medications Prior to Visit  Medication Sig Dispense Refill  . cetirizine (ZYRTEC) 10 MG tablet Take 1 tablet (10 mg total) by mouth daily. 30 tablet 11  . diclofenac  sodium (VOLTAREN) 1 % GEL Apply 2 g topically 4 (four) times daily. 100 g 1  . ferrous sulfate (FERROUSUL) 325 (65 FE) MG tablet Take 1 tablet (325 mg total) by mouth 2 (two) times daily with a meal. 60 tablet 1  . meloxicam (MOBIC) 15 MG tablet Take 1 tablet (15 mg total) by mouth daily. 30 tablet 0  . omeprazole (PRILOSEC) 40 MG capsule Take 1 capsule (40 mg total) by mouth daily. 30 capsule 3  . sildenafil (VIAGRA) 25 MG tablet Take 1 tablet (25 mg total) by mouth daily as needed for erectile dysfunction (one hour prior to sexual activity). 10 tablet 0  . Vitamin D, Ergocalciferol, (DRISDOL) 50000 units CAPS capsule Take 1 capsule (50,000 Units total) by mouth every 7 (seven) days. 24 capsule 0   No facility-administered medications prior to visit.     No Known Allergies  ROS Review of Systems    Objective:    Physical Exam  BP 119/66   Pulse 95   Resp 17   Ht 5' 9.5" (1.765 m)   Wt 100 lb 9.6 oz (45.6 kg)   SpO2 97%  BMI 14.64 kg/m  Wt Readings from Last 3 Encounters:  02/07/19 100 lb 9.6 oz (45.6 kg)  12/22/18 104 lb 6.4 oz (47.4 kg)  12/02/18 102 lb (46.3 kg)     Health Maintenance Due  Topic Date Due  . TETANUS/TDAP  12/28/2001    There are no preventive care reminders to display for this patient.  Lab Results  Component Value Date   TSH 2.250 10/14/2018   Lab Results  Component Value Date   WBC 3.6 12/22/2018   HGB 12.3 (L) 12/22/2018   HCT 39.2 12/22/2018   MCV 69 (L) 12/22/2018   PLT 402 12/22/2018   Lab Results  Component Value Date   NA 139 10/14/2018   K 4.7 10/14/2018   CO2 26 10/14/2018   GLUCOSE 95 10/14/2018   BUN 9 10/14/2018   CREATININE 0.74 (L) 10/14/2018   BILITOT 0.2 10/14/2018   ALKPHOS 124 (H) 10/14/2018   AST 18 10/14/2018   ALT 6 10/14/2018   PROT 7.8 10/14/2018   ALBUMIN 3.5 10/14/2018   CALCIUM 9.1 10/14/2018   No results found for: CHOL No results found for: HDL No results found for: LDLCALC No results found for:  TRIG No results found for: CHOLHDL No results found for: FSFS2L    Assessment & Plan:   Problem List Items Addressed This Visit    None      No orders of the defined types were placed in this encounter.   Follow-up: No follow-ups on file.    Joaquin Courts, FNP

## 2019-02-07 NOTE — Progress Notes (Deleted)
Acute Office Visit  Subjective:    Patient ID: Calvin Pittman, male    DOB: 1981-10-24, 38 y.o.   MRN: [id number]  Chief Complaint  Patient presents with  . Leg Pain    HPI Patient is in today for ***  No past medical history on file.  No past surgical history on file.  No family history on file.  Social History   Socioeconomic History  . Marital status: Married    Spouse name: Not on file  . Number of children: 12  . Years of education: Not on file  . Highest education level: Not on file  Occupational History  . Not on file  Social Needs  . Financial resource strain: Not on file  . Food insecurity:    Worry: Not on file    Inability: Not on file  . Transportation needs:    Medical: Not on file    Non-medical: Not on file  Tobacco Use  . Smoking status: Never Smoker  . Smokeless tobacco: Never Used  Substance and Sexual Activity  . Alcohol use: Never    Frequency: Never  . Drug use: Never  . Sexual activity: Yes    Partners: Female  Lifestyle  . Physical activity:    Days per week: Not on file    Minutes per session: Not on file  . Stress: Not on file  Relationships  . Social connections:    Talks on phone: Not on file    Gets together: Not on file    Attends religious service: Not on file    Active member of club or organization: Not on file    Attends meetings of clubs or organizations: Not on file    Relationship status: Not on file  . Intimate partner violence:    Fear of current or ex partner: Not on file    Emotionally abused: Not on file    Physically abused: Not on file    Forced sexual activity: Not on file  Other Topics Concern  . Not on file  Social History Narrative  . Not on file    Outpatient Medications Prior to Visit  Medication Sig Dispense Refill  . cetirizine (ZYRTEC) 10 MG tablet Take 1 tablet (10 mg total) by mouth daily. 30 tablet 11  . diclofenac sodium (VOLTAREN) 1 % GEL Apply 2 g topically 4 (four) times daily.  100 g 1  . ferrous sulfate (FERROUSUL) 325 (65 FE) MG tablet Take 1 tablet (325 mg total) by mouth 2 (two) times daily with a meal. 60 tablet 1  . meloxicam (MOBIC) 15 MG tablet Take 1 tablet (15 mg total) by mouth daily. 30 tablet 0  . omeprazole (PRILOSEC) 40 MG capsule Take 1 capsule (40 mg total) by mouth daily. 30 capsule 3  . sildenafil (VIAGRA) 25 MG tablet Take 1 tablet (25 mg total) by mouth daily as needed for erectile dysfunction (one hour prior to sexual activity). 10 tablet 0  . Vitamin D, Ergocalciferol, (DRISDOL) 50000 units CAPS capsule Take 1 capsule (50,000 Units total) by mouth every 7 (seven) days. 24 capsule 0   No facility-administered medications prior to visit.     No Known Allergies  ROS     Objective:    Physical Exam  BP 119/66   Pulse 95   Resp 17   Ht 5' 9.5" (1.765 m)   Wt 100 lb 9.6 oz (45.6 kg)   SpO2 97%   BMI 14.64 kg/m  Wt  Readings from Last 3 Encounters:  02/07/19 100 lb 9.6 oz (45.6 kg)  12/22/18 104 lb 6.4 oz (47.4 kg)  12/02/18 102 lb (46.3 kg)    Health Maintenance Due  Topic Date Due  . TETANUS/TDAP  12/28/2001    There are no preventive care reminders to display for this patient.   Lab Results  Component Value Date   TSH 2.250 10/14/2018   Lab Results  Component Value Date   WBC 3.6 12/22/2018   HGB 12.3 (L) 12/22/2018   HCT 39.2 12/22/2018   MCV 69 (L) 12/22/2018   PLT 402 12/22/2018   Lab Results  Component Value Date   NA 139 10/14/2018   K 4.7 10/14/2018   CO2 26 10/14/2018   GLUCOSE 95 10/14/2018   BUN 9 10/14/2018   CREATININE 0.74 (L) 10/14/2018   BILITOT 0.2 10/14/2018   ALKPHOS 124 (H) 10/14/2018   AST 18 10/14/2018   ALT 6 10/14/2018   PROT 7.8 10/14/2018   ALBUMIN 3.5 10/14/2018   CALCIUM 9.1 10/14/2018   No results found for: CHOL No results found for: HDL No results found for: LDLCALC No results found for: TRIG No results found for: CHOLHDL No results found for: ASNK5L     Assessment &  Plan:   Problem List Items Addressed This Visit    None       No orders of the defined types were placed in this encounter.    Joaquin Courts, FNP

## 2019-02-07 NOTE — Patient Instructions (Addendum)
I am referring you to physical medicine for chronic musculoskeletal pain Please take tylenol over the counter as needed for pain I am prescribing an albuterol inhaler as needed for shortness of breath I am giving you some ensure today to help with nutritional needs   Musculoskeletal Pain Musculoskeletal pain refers to aches and pains in your bones, joints, muscles, and the tissues that surround them. This pain can occur in any part of the body. It can last for a short time (acute) or a long time (chronic). A physical exam, lab tests, and imaging studies may be done to find the cause of your musculoskeletal pain. Follow these instructions at home:  Lifestyle  Try to control or lower your stress levels. Stress increases muscle tension and can worsen musculoskeletal pain. It is important to recognize when you are anxious or stressed and learn ways to manage it. This may include: ? Meditation or yoga. ? Cognitive or behavioral therapy. ? Acupuncture or massage therapy.  You may continue all activities unless the activities cause more pain. When the pain gets better, slowly resume your normal activities. Gradually increase the intensity and duration of your activities or exercise. Managing pain, stiffness, and swelling  Take over-the-counter and prescription medicines only as told by your health care provider.  When your pain is severe, bed rest may be helpful. Lie or sit in any position that is comfortable, but get out of bed and walk around at least every couple of hours.  If directed, apply heat to the affected area as often as told by your health care provider. Use the heat source that your health care provider recommends, such as a moist heat pack or a heating pad. ? Place a towel between your skin and the heat source. ? Leave the heat on for 20-30 minutes. ? Remove the heat if your skin turns bright red. This is especially important if you are unable to feel pain, heat, or cold. You may  have a greater risk of getting burned.  If directed, put ice on the painful area. ? Put ice in a plastic bag. ? Place a towel between your skin and the bag. ? Leave the ice on for 20 minutes, 2-3 times a day. General instructions  Your health care provider may recommend that you see a physical therapist. This person can help you come up with a safe exercise program. Do any exercises as told by your physical therapist.  Keep all follow-up visits, including any physical therapy visits, as told by your health care providers. This is important. Contact a health care provider if:  Your pain gets worse.  Medicines do not help ease your pain.  You cannot use the part of your body that hurts, such as your arm, leg, or neck.  You have trouble sleeping.  You have trouble doing your normal activities. Get help right away if:  You have a new injury and your pain is worse or different.  You feel numb or you have tingling in the painful area. Summary  Musculoskeletal pain refers to aches and pains in your bones, joints, muscles, and the tissues that surround them.  This pain can occur in any part of the body.  Your health care provider may recommend that you see a physical therapist. This person can help you come up with a safe exercise program. Do any exercises as told by your physical therapist.  Lower your stress level. Stress can worsen musculoskeletal pain. Ways to lower stress may include  meditation, yoga, cognitive or behavioral therapy, acupuncture, and massage therapy. This information is not intended to replace advice given to you by your health care provider. Make sure you discuss any questions you have with your health care provider. Document Released: 12/14/2005 Document Revised: 01/13/2017 Document Reviewed: 01/13/2017 Elsevier Interactive Patient Education  2019 [employer]

## 2019-02-14 NOTE — Progress Notes (Signed)
Established Patient Office Visit  Subjective:  Patient ID: Calvin Pittman, male    DOB: [birth year]  Age: 38 y.o. MRN: [id number]  CC:  Chief Complaint  Patient presents with  . Leg Pain    HPI [name] presents for evaluation of ongoing leg pain. Patient recently completed physical therapy to improve chronic neck pain and improve abnormal posturing. Reports only mild relief with recent PT therapy.  He showed his scheduled physical therapy appointment on yesterday.  He has chronic musculoskeletal pain. He recently was diagnosed with a severe iron deficiency which was replete with IV iron.  Also suffers from a vitamin D deficiency and has been on chronic vitamin D replacement since October.  Currently prescribed Voltaren gel along with Mobic and reports mild relief however the Mobic is causing some GI problems. He has chronic acid reflux and is prescribed omeprazole 40 mg once daily. He characterizes current leg pain as a sensation of his legs are tight and achy.  Denies recent injury or falls.  Denies weakness or numbness or tingling in lower extremities. Complains of intermittent shortness of breath with activity. No known history of asthma. Denies chest tightness or wheezing. No current cough or associated URI symptoms. Unknown of factors that exacerbate shortness of breath.  Social History   Socioeconomic History  . Marital status: Married    Spouse name: Not on file  . Number of children: 12  . Years of education: Not on file  . Highest education level: Not on file  Occupational History  . Not on file  Social Needs  . Financial resource strain: Not on file  . Food insecurity:    Worry: Not on file    Inability: Not on file  . Transportation needs:    Medical: Not on file    Non-medical: Not on file  Tobacco Use  . Smoking status: Never Smoker  . Smokeless tobacco: Never Used  Substance and Sexual Activity  . Alcohol use: Never    Frequency: Never  . Drug use:  Never  . Sexual activity: Yes    Partners: Female  Lifestyle  . Physical activity:    Days per week: Not on file    Minutes per session: Not on file  . Stress: Not on file  Relationships  . Social connections:    Talks on phone: Not on file    Gets together: Not on file    Attends religious service: Not on file    Active member of club or organization: Not on file    Attends meetings of clubs or organizations: Not on file    Relationship status: Not on file  . Intimate partner violence:    Fear of current or ex partner: Not on file    Emotionally abused: Not on file    Physically abused: Not on file    Forced sexual activity: Not on file  Other Topics Concern  . Not on file  Social History Narrative  . Not on file    Outpatient Medications Prior to Visit  Medication Sig Dispense Refill  . cetirizine (ZYRTEC) 10 MG tablet Take 1 tablet (10 mg total) by mouth daily. 30 tablet 11  . diclofenac sodium (VOLTAREN) 1 % GEL Apply 2 g topically 4 (four) times daily. 100 g 1  . ferrous sulfate (FERROUSUL) 325 (65 FE) MG tablet Take 1 tablet (325 mg total) by mouth 2 (two) times daily with a meal. 60 tablet 1  . meloxicam (MOBIC) 15 MG tablet  Take 1 tablet (15 mg total) by mouth daily. 30 tablet 0  . omeprazole (PRILOSEC) 40 MG capsule Take 1 capsule (40 mg total) by mouth daily. 30 capsule 3  . sildenafil (VIAGRA) 25 MG tablet Take 1 tablet (25 mg total) by mouth daily as needed for erectile dysfunction (one hour prior to sexual activity). 10 tablet 0  . Vitamin D, Ergocalciferol, (DRISDOL) 50000 units CAPS capsule Take 1 capsule (50,000 Units total) by mouth every 7 (seven) days. 24 capsule 0   No facility-administered medications prior to visit.     No Known Allergies  ROS Review of Systems Pertinent negatives listed in HPI  Objective:    Physical Exam BP 119/66   Pulse 95   Temp 98.4 F (36.9 C)   Resp 17   Ht 5' 9.5" (1.765 m)   Wt 100 lb 9.6 oz (45.6 kg)   SpO2 97%    BMI 14.64 kg/m    General appearance: alert, cachexia, cooperative and in no distress Head: Normocephalic, without obvious abnormality, atraumatic Respiratory: Respirations even and unlabored, normal respiratory rate, no wheezes or rhonchi  Heart: Regular rate and rhythm. No murmurs or gallops.  Extremities: No gross deformities,  Skin: Skin color, texture, turgor normal. No rashes seen  Psych: Appropriate mood and affect. Neurologic: Mental status: Alert, oriented to person, place, and time, thought content appropriate.    Wt Readings from Last 3 Encounters:  02/07/19 100 lb 9.6 oz (45.6 kg)  12/22/18 104 lb 6.4 oz (47.4 kg)  12/02/18 102 lb (46.3 kg)     Health Maintenance Due  Topic Date Due  . TETANUS/TDAP  12/28/2001    There are no preventive care reminders to display for this patient.  Lab Results  Component Value Date   TSH 2.250 10/14/2018   Lab Results  Component Value Date   WBC 3.6 12/22/2018   HGB 12.3 (L) 12/22/2018   HCT 39.2 12/22/2018   MCV 69 (L) 12/22/2018   PLT 402 12/22/2018   Lab Results  Component Value Date   NA 139 10/14/2018   K 4.7 10/14/2018   CO2 26 10/14/2018   GLUCOSE 95 10/14/2018   BUN 9 10/14/2018   CREATININE 0.74 (L) 10/14/2018   BILITOT 0.2 10/14/2018   ALKPHOS 124 (H) 10/14/2018   AST 18 10/14/2018   ALT 6 10/14/2018   PROT 7.8 10/14/2018   ALBUMIN 3.5 10/14/2018   CALCIUM 9.1 10/14/2018     Assessment & Plan:  1. Chronic musculoskeletal pain - Ambulatory referral to Pain Clinic 2. Neck pain - Ambulatory referral to Pain Clinic 3. Pain in both lower extremities - Ambulatory referral to Pain Clinic  Patient has complained of generalized musculoskeletal pain chronically over the last several months. His pain was initially relieved with NSAIDs however continues to recur and NSAIDS are not as effective as they were initially.  He has been referred to physical therapy per his reports today this therapy was ineffective  in relieving his pain.  He is currently receiving oral vitamin D replacement to correct the severe deficiency.  At this point [facility] evaluation and management.   4. Dyspnea, unspecified type, new Trial albuterol PRN, 2 puffs every 6 hours as needed  Meds ordered this encounter  Medications  . albuterol (PROVENTIL HFA;VENTOLIN HFA) 108 (90 Base) MCG/ACT inhaler    Sig: Inhale 2 puffs into the lungs every 6 (six) hours as needed for wheezing or shortness  of breath.    Dispense:  1 Inhaler    Refill:  0    Follow-up: Return if symptoms worsen or fail to improve.    Joaquin Courts, FNP-C

## 2019-02-15 ENCOUNTER — Ambulatory Visit: Payer: Medicaid Other | Admitting: Physical Therapy

## 2019-02-15 DIAGNOSIS — M542 Cervicalgia: Secondary | ICD-10-CM

## 2019-02-15 DIAGNOSIS — R293 Abnormal posture: Secondary | ICD-10-CM

## 2019-02-15 NOTE — Therapy (Signed)
[facility] [address] [place], [place], [address] Phone: [phone]   Fax:  [phone]  Physical Therapy Treatment  Patient Details  Name: Calvin Pittman MRN: [id number] Date of Birth: 1981/03/25 Referring Provider (PT): Bing Neighbors, FNP   Encounter Date: 02/15/2019  PT End of Session - 02/15/19 1115    Visit Number  5    Number of Visits  13    Date for PT Re-Evaluation  02/28/19    Authorization Type  Medicaid     Authorization Time Period  Second   auth 02/05/2019-03/18/2019    Authorization - Visit Number  1    Authorization - Number of Visits  12    PT Start Time  1113   pt 13 minutes late    PT Stop Time  1145    PT Time Calculation (min)  32 min       No past medical history on file.  No past surgical history on file.  There were no vitals filed for this visit.  Subjective Assessment - 02/15/19 1117    Subjective  PT is helping the the upper back pain. My neck still hurts the same an the sides.     Currently in Pain?  Yes    Pain Score  6     Pain Location  Neck    Pain Orientation  Right;Left;Lateral    Pain Descriptors / Indicators  Tightness    Pain Type  Chronic pain    Aggravating Factors   turning head     Pain Relieving Factors  rest          [facility] PT Assessment - 02/15/19 0001      AROM   Cervical - Right Rotation  25    Cervical - Left Rotation  30                   [facility] Adult PT Treatment/Exercise - 02/15/19 0001      Neck Exercises: Supine   Neck Retraction  10 reps;5 secs    Neck Retraction Limitations  attempted head lift       Shoulder Exercises: Supine   Protraction  10 reps;AROM    Horizontal ABduction  20 reps    Theraband Level (Shoulder Horizontal ABduction)  Level 2 (Red)    Diagonals  20 reps    Theraband Level (Shoulder Diagonals)  Level 2 (Red)    Other Supine Exercises  scapular retraction with ER bil 2 x 20 with red band    Other Supine  Exercises  scap retract into table x 10       Shoulder Exercises: Stretch   Other Shoulder Stretches  lower trunk rotation focusing on keeping upper body neutral, the added opposite head turns                PT Short Term Goals - 01/26/19 1508      PT SHORT TERM GOAL #1   Title  pt to be I with inital HEP    Time  3    Status  Achieved      PT SHORT TERM GOAL #2   Title  pt to verbalize/ demo proper posture and lifting mechanics to reduce and prevent low back pain     Time  3    Period  Weeks    Status  Achieved        PT Long Term Goals - 01/26/19 9741  PT LONG TERM GOAL #1   Title  increase cervical mobility to >/= 10 degrees in all planes to promote funtional cervical mobility with </= 2/10 pain     Baseline  flexion 19 degrees, extension 29 degrees, bil R sidebend 28, L 13 , R rotation 31, L rotation 38 degrees    Time  6    Period  Weeks    Status  On-going    Target Date  02/28/19      PT LONG TERM GOAL #2   Title  decrease muscle spasm in bil Rhomboids and upper trap to reduce pain and promote functional mobility    Baseline  continued spasm in bil Rhomboid as well as upper trap R>L    Time  6    Period  Weeks    Status  On-going    Target Date  02/28/19      PT LONG TERM GOAL #3   Title  pt to report no pain at night and getting out of bed in the morning for QOL    Baseline  pain at 6-8/10 when getting out of bed    Time  6    Period  Weeks    Status  On-going    Target Date  02/28/19      PT LONG TERM GOAL #4   Title  to be I with all HEP given as of last visit maintain and progress current level of function .     Baseline  independent with current HEP and progressing as able.     Time  6    Period  Weeks    Status  On-going    Target Date  02/28/19            Plan - 02/15/19 1158    Clinical Impression Statement  Pt reports PT is helping his pain. Pt reports upper back pain decreasing however neck pain continues Neck AROM less  than last measurement. Worked on deep neck flexor strength with pt able to lift head x 5 from pillow however with increased pain. Reviewed red band therex and added trunk rotations with opposite head turns and updated HEP. Pt with questions about Medicaid transportation and was directed to the front desk with inquiry.     PT Next Visit Plan  Did he set up Medicaid Transportation? update HEP PRN, assess shoulder strength, upper trap and surrounding musculature stretching, posture education,     PT Home Exercise Plan  chin tucks, thoracic rotation, upper trap stretch, rhomboid stretch, supine yellow band Er, LTR with opposite head turns.     Consulted and Agree with Plan of Care  Patient       Patient will benefit from skilled therapeutic intervention in order to improve the following deficits and impairments:  Pain, Increased fascial restricitons, Increased muscle spasms, Decreased endurance, Decreased activity tolerance, Postural dysfunction, Improper body mechanics, Decreased range of motion  Visit Diagnosis: Cervicalgia  Abnormal posture     Problem List Patient Active Problem List   Diagnosis Date Noted  . Chronic musculoskeletal pain 02/07/2019  . Cachexia (HCC) 02/07/2019  . Erectile dysfunction 10/27/2018  . Pain of right lower extremity 10/27/2018    Sherrie Mustache, [place] 02/15/2019, 12:05 PM  [facility] [address] [place], [place], [address] Phone: [phone]   Fax:  [phone]  Name: Othal Rackow MRN: [id number] Date of Birth: 12/15/1981

## 2019-02-22 ENCOUNTER — Ambulatory Visit: Payer: Medicaid Other | Admitting: Physical Therapy

## 2019-02-22 ENCOUNTER — Encounter: Payer: Self-pay | Admitting: Physical Therapy

## 2019-02-22 DIAGNOSIS — M542 Cervicalgia: Secondary | ICD-10-CM | POA: Diagnosis not present

## 2019-02-22 DIAGNOSIS — R293 Abnormal posture: Secondary | ICD-10-CM

## 2019-02-22 NOTE — Therapy (Signed)
[facility] Outpatient Rehabilitation [facility] [address] [place], [place], [address] Phone: [phone]   Fax:  [phone]  Physical Therapy Treatment / Re-certification  Patient Details  Name: Calvin Pittman MRN: [id number] Date of Birth: 1981/11/13 Referring Provider (PT): Bing Neighbors, FNP   Encounter Date: 02/22/2019  PT End of Session - 02/22/19 1146    Visit Number  6    Number of Visits  13    Date for PT Re-Evaluation  03/22/19    Authorization Type  Medicaid     Authorization Time Period  Second   auth 02/05/2019-03/18/2019    Authorization - Visit Number  2    Authorization - Number of Visits  12    PT Start Time  1146    PT Stop Time  1224    PT Time Calculation (min)  38 min    Activity Tolerance  Patient tolerated treatment well    Behavior During Therapy  [facility] for tasks assessed/performed       History reviewed. No pertinent past medical history.  History reviewed. No pertinent surgical history.  There were no vitals filed for this visit.  Subjective Assessment - 02/22/19 1150    Subjective  "I am doing better, but the pain is still in the neck and is worse on the R"    Patient Stated Goals  to get to feeling better    Currently in Pain?  Yes    Pain Score  4     Pain Location  Neck    Pain Orientation  Right;Left;Lateral    Pain Descriptors / Indicators  Aching;Tightness;Sore    Pain Type  Chronic pain    Pain Onset  More than a month ago    Pain Frequency  Constant    Aggravating Factors   turning the head    Pain Relieving Factors  resting         [facility] PT Assessment - 02/22/19 0001      Assessment   Medical Diagnosis  Neck pain     Referring Provider (PT)  Bing Neighbors, FNP      AROM   Cervical Flexion  25    Cervical Extension  20    Cervical - Right Side Bend  18    Cervical - Left Side Bend  18    Cervical - Right Rotation  34    Cervical - Left Rotation  49      Strength   Overall Strength  Comments  overall bil shoulder strength grossly 4-/5     Strength Assessment Site  Shoulder                   [facility] Adult PT Treatment/Exercise - 02/22/19 0001      Neck Exercises: Seated   Other Seated Exercise  scapular retraction 2 x 10      Neck Exercises: Supine   Cervical Isometrics  Flexion;Extension;Right lateral flexion;Left lateral flexion;Right rotation;Left rotation      Lumbar Exercises: Supine   Ab Set  10 reps;5 seconds      Manual Therapy   Manual Therapy  Joint mobilization;Soft tissue mobilization    Manual therapy comments  MTPR to R upper trap, L SCM     Joint Mobilization  R first rib mobs grade III with pt breathing in and out for add MWM    Soft tissue mobilization  SCM rolling / fascial release bil      Neck Exercises: Stretches  Upper Trapezius Stretch  1 rep;30 seconds             PT Education - 02/22/19 1242    Education Details  worked on stting up [employer] transportation during visit with interpreter present. left message with his caseworker with his number to call back to help with transportation. updated HEP for cervical isometrics.     Person(s) Educated  Patient    Methods  Explanation;Handout;Verbal cues    Comprehension  Verbalized understanding;Verbal cues required       PT Short Term Goals - 01/26/19 1508      PT SHORT TERM GOAL #1   Title  pt to be I with inital HEP    Time  3    Status  Achieved      PT SHORT TERM GOAL #2   Title  pt to verbalize/ demo proper posture and lifting mechanics to reduce and prevent low back pain     Time  3    Period  Weeks    Status  Achieved        PT Long Term Goals - 02/22/19 1235      PT LONG TERM GOAL #1   Title  increase cervical mobility to >/= 10 degrees in all planes to promote funtional cervical mobility with </= 2/10 pain     Time  6    Period  Weeks    Status  On-going    Target Date  03/22/19      PT LONG TERM GOAL #2   Title  decrease muscle spasm in bil  Rhomboids and upper trap to reduce pain and promote functional mobility    Time  6    Period  Weeks    Status  On-going    Target Date  03/22/19      PT LONG TERM GOAL #3   Title  pt to report no pain at night and getting out of bed in the morning for QOL    Time  6    Period  Weeks    Status  On-going    Target Date  03/22/19      PT LONG TERM GOAL #4   Title  to be I with all HEP given as of last visit maintain and progress current level of function .     Baseline  independent with current HEP and progressing as able.     Time  6    Period  Weeks    Status  On-going    Target Date  03/22/19            Plan - 02/22/19 1231    Clinical Impression Statement  pt reports he is doing better but continues to have pain in bil sides of the neck with R>L. HE demonstrates improvement in cervical ROM measurements but does tend to compensate with throacic movement. continued STW to relieve tension, he is making progress with goals. continued working Education officer, museum. plan to continue with current POC workiing on cervical, shoulder, and core strengthening.     Rehab Potential  Good    PT Frequency  2x / week    PT Duration  4 weeks    PT Treatment/Interventions  ADLs/Self Care Home Management;Cryotherapy;Electrical Stimulation;Iontophoresis 4mg /ml Dexamethasone;Moist Heat;Traction;Therapeutic exercise;Therapeutic activities;Manual techniques;Dry needling;Patient/family education;Taping;Balance training;Passive range of motion;Ultrasound    PT Next Visit Plan  Did he set up Medicaid Transportation? update HEP PRN, , upper trap and surrounding musculature stretching, posture education,  PT Home Exercise Plan  chin tucks, thoracic rotation, upper trap stretch, rhomboid stretch, supine yellow band Er, LTR with opposite head turns. cervical isometrics.     Consulted and Agree with Plan of Care  Patient       Patient will benefit from skilled therapeutic intervention in order  to improve the following deficits and impairments:  Pain, Increased fascial restricitons, Increased muscle spasms, Decreased endurance, Decreased activity tolerance, Postural dysfunction, Improper body mechanics, Decreased range of motion  Visit Diagnosis: Cervicalgia  Abnormal posture     Problem List Patient Active Problem List   Diagnosis Date Noted  . Chronic musculoskeletal pain 02/07/2019  . Cachexia (HCC) 02/07/2019  . Erectile dysfunction 10/27/2018  . Pain of right lower extremity 10/27/2018   Lulu Riding PT, DPT, LAT, ATC  02/22/19  12:43 PM      [facility] Outpatient Rehabilitation [facility] [address] [place], [place], [address] Phone: [phone]   Fax:  [phone]  Name: Andreaus Tezak MRN: [id number] Date of Birth: 1981/02/19

## 2019-02-28 ENCOUNTER — Encounter: Payer: Self-pay | Admitting: Physical Medicine & Rehabilitation

## 2019-03-01 ENCOUNTER — Encounter: Payer: Self-pay | Admitting: Physical Therapy

## 2019-03-01 ENCOUNTER — Ambulatory Visit: Payer: Medicaid Other | Attending: Family Medicine | Admitting: Physical Therapy

## 2019-03-01 DIAGNOSIS — M542 Cervicalgia: Secondary | ICD-10-CM | POA: Diagnosis not present

## 2019-03-01 DIAGNOSIS — R293 Abnormal posture: Secondary | ICD-10-CM | POA: Diagnosis present

## 2019-03-01 NOTE — Therapy (Signed)
[facility] Outpatient Rehabilitation [facility] [address] [place], [place], [address] Phone: [phone]   Fax:  [phone]  Physical Therapy Treatment  Patient Details  Name: Calvin Pittman MRN: [id number] Date of Birth: Sep 03, 1981 Referring Provider (PT): Bing Neighbors, FNP   Encounter Date: 03/01/2019  PT End of Session - 03/01/19 0911    Visit Number  7    Number of Visits  13    Date for PT Re-Evaluation  03/22/19    Authorization Type  Medicaid     Authorization Time Period  Second   auth 02/05/2019-03/18/2019    Authorization - Visit Number  3    Authorization - Number of Visits  12    PT Start Time  0847    PT Stop Time  0940    PT Time Calculation (min)  53 min       History reviewed. No pertinent past medical history.  History reviewed. No pertinent surgical history.  There were no vitals filed for this visit.  Subjective Assessment - 03/01/19 0942    Subjective  Pain is better but I still have pain     Patient is accompained by:  Interpreter   phone interpreter   Currently in Pain?  Yes    Pain Score  4     Pain Location  Neck    Pain Orientation  Right;Left    Pain Descriptors / Indicators  Aching;Tightness;Sore    Aggravating Factors   getting up from     Pain Relieving Factors  rest                        [facility] Adult PT Treatment/Exercise - 03/01/19 0001      Neck Exercises: Supine   Neck Retraction  10 reps;5 secs    Neck Retraction Limitations  head lift x 5 for 5 seconds      Neck Exercises: Stabilization   Stabilization  Supine chin tuck with arms circles x 10 each way, horizontal abduction x 10 , alternating UE flexion/extension x 10       Shoulder Exercises: Supine   Horizontal ABduction  20 reps    Theraband Level (Shoulder Horizontal ABduction)  Level 2 (Red)    Flexion Limitations  pullovers with red band x 10    Other Supine Exercises  scapular retraction with ER bil 2 x 20 with red band       Shoulder Exercises: Prone   Other Prone Exercises  prone scap retract with extension x 10       Shoulder Exercises: Stretch   Other Shoulder Stretches  lower trunk rotation focusing on keeping upper body neutral, the added opposite head turns     Other Shoulder Stretches  open books       Modalities   Modalities  Moist Heat      Moist Heat Therapy   Number Minutes Moist Heat  10 Minutes    Moist Heat Location  Cervical      Manual Therapy   Soft tissue mobilization  Prone in massage table, soft tissue work and TPR upper traps , periscap                PT Short Term Goals - 01/26/19 1508      PT SHORT TERM GOAL #1   Title  pt to be I with inital HEP    Time  3    Status  Achieved  PT SHORT TERM GOAL #2   Title  pt to verbalize/ demo proper posture and lifting mechanics to reduce and prevent low back pain     Time  3    Period  Weeks    Status  Achieved        PT Long Term Goals - 02/22/19 1235      PT LONG TERM GOAL #1   Title  increase cervical mobility to >/= 10 degrees in all planes to promote funtional cervical mobility with </= 2/10 pain     Time  6    Period  Weeks    Status  On-going    Target Date  03/22/19      PT LONG TERM GOAL #2   Title  decrease muscle spasm in bil Rhomboids and upper trap to reduce pain and promote functional mobility    Time  6    Period  Weeks    Status  On-going    Target Date  03/22/19      PT LONG TERM GOAL #3   Title  pt to report no pain at night and getting out of bed in the morning for QOL    Time  6    Period  Weeks    Status  On-going    Target Date  03/22/19      PT LONG TERM GOAL #4   Title  to be I with all HEP given as of last visit maintain and progress current level of function .     Baseline  independent with current HEP and progressing as able.     Time  6    Period  Weeks    Status  On-going    Target Date  03/22/19            Plan - 03/01/19 1001    Clinical Impression Statement   Pt arrives without interpreter. [place] interpreter not available for his language. Used phone for interpreter today. Pt placed in prone and PTA performed gentle STW to upper backand upper traps. Pt became uncomfortable in legs and right hip. Trial of HMP to neck today. He is requesting exercises for LEs.     PT Next Visit Plan  He wants to add LE strength. Did he set up Medicaid Transportation? update HEP PRN, , upper trap and surrounding musculature stretching, posture education,     PT Home Exercise Plan  chin tucks, thoracic rotation, upper trap stretch, rhomboid stretch, supine yellow band Er, LTR with opposite head turns. cervical isometrics.     Consulted and Agree with Plan of Care  Patient       Patient will benefit from skilled therapeutic intervention in order to improve the following deficits and impairments:  Pain, Increased fascial restricitons, Increased muscle spasms, Decreased endurance, Decreased activity tolerance, Postural dysfunction, Improper body mechanics, Decreased range of motion  Visit Diagnosis: Cervicalgia  Abnormal posture     Problem List Patient Active Problem List   Diagnosis Date Noted  . Chronic musculoskeletal pain 02/07/2019  . Cachexia (HCC) 02/07/2019  . Erectile dysfunction 10/27/2018  . Pain of right lower extremity 10/27/2018    Sherrie Mustache, [place] 03/01/2019, 10:04 AM  [facility] [address] [place], [place], [address] Phone: [phone]   Fax:  [phone]  Name: Torey Slauson MRN: [id number] Date of Birth: 08/08/1981

## 2019-03-03 ENCOUNTER — Ambulatory Visit: Payer: Medicaid Other | Admitting: Physical Therapy

## 2019-03-03 ENCOUNTER — Encounter: Payer: Self-pay | Admitting: Physical Therapy

## 2019-03-03 DIAGNOSIS — R293 Abnormal posture: Secondary | ICD-10-CM

## 2019-03-03 DIAGNOSIS — M542 Cervicalgia: Secondary | ICD-10-CM

## 2019-03-03 NOTE — Therapy (Signed)
[facility] Outpatient Rehabilitation [facility] [address] [place], [place], [address] Phone: [phone]   Fax:  [phone]  Physical Therapy Treatment  Patient Details  Name: Calvin Pittman MRN: [id number] Date of Birth: 1981-07-31 Referring Provider (PT): Bing Neighbors, FNP   Encounter Date: 03/03/2019  PT End of Session - 03/03/19 0856    Visit Number  8    Number of Visits  13    Date for PT Re-Evaluation  03/22/19    Authorization Type  Medicaid     Authorization Time Period  Second   auth 02/05/2019-03/18/2019    Authorization - Visit Number  4    Authorization - Number of Visits  12    PT Start Time  0850    PT Stop Time  0948    PT Time Calculation (min)  58 min       History reviewed. No pertinent past medical history.  History reviewed. No pertinent surgical history.  There were no vitals filed for this visit.  Subjective Assessment - 03/03/19 0855    Subjective  My chest hurts when I lay down, its tight in my chest. The masag really helped. I felt like I was standing straight. I felt really good when I left the building.                        [facility] Adult PT Treatment/Exercise - 03/03/19 0001      Neck Exercises: Supine   Neck Retraction  10 reps;5 secs    Neck Retraction Limitations  head lift x 5 for 5 seconds    Other Supine Exercise  bridge x 10 , Marching/dead bug for core stabilization       Shoulder Exercises: Supine   Horizontal ABduction  20 reps    Theraband Level (Shoulder Horizontal ABduction)  Level 2 (Red)    Flexion Limitations  pullovers with red band x 10    Diagonals  10 reps    Theraband Level (Shoulder Diagonals)  Level 2 (Red)    Other Supine Exercises  scapular retraction with ER bil 2 x 20 with red band      Shoulder Exercises: Prone   Other Prone Exercises  prone scap retract with extension x 10       Shoulder Exercises: Stretch   Other Shoulder Stretches  lower trunk rotation focusing  on keeping upper body neutral, the added opposite head turns       Manual Therapy   Soft tissue mobilization  Prone in massage table, soft tissue work and TPR upper traps , periscap                PT Short Term Goals - 01/26/19 1508      PT SHORT TERM GOAL #1   Title  pt to be I with inital HEP    Time  3    Status  Achieved      PT SHORT TERM GOAL #2   Title  pt to verbalize/ demo proper posture and lifting mechanics to reduce and prevent low back pain     Time  3    Period  Weeks    Status  Achieved        PT Long Term Goals - 02/22/19 1235      PT LONG TERM GOAL #1   Title  increase cervical mobility to >/= 10 degrees in all planes to promote funtional cervical mobility with </= 2/10 pain  Time  6    Period  Weeks    Status  On-going    Target Date  03/22/19      PT LONG TERM GOAL #2   Title  decrease muscle spasm in bil Rhomboids and upper trap to reduce pain and promote functional mobility    Time  6    Period  Weeks    Status  On-going    Target Date  03/22/19      PT LONG TERM GOAL #3   Title  pt to report no pain at night and getting out of bed in the morning for QOL    Time  6    Period  Weeks    Status  On-going    Target Date  03/22/19      PT LONG TERM GOAL #4   Title  to be I with all HEP given as of last visit maintain and progress current level of function .     Baseline  independent with current HEP and progressing as able.     Time  6    Period  Weeks    Status  On-going    Target Date  03/22/19            Plan - 03/03/19 0940    Clinical Impression Statement  Interpreter arrived late but was present this visit. Pt reports improvement after manual work last visit and was able to stand more erect. Pain still present in lateral neck musculature. Pt placed prone again and soft tissue work performer to neck, upper traps and periscap. Repeated prone scap squeezes and HMP.     PT Next Visit Plan  continue manual in prone position.  He wants to add LE strength. Did he set up Medicaid Transportation? update HEP PRN, , upper trap and surrounding musculature stretching, posture education,     PT Home Exercise Plan  chin tucks, thoracic rotation, upper trap stretch, rhomboid stretch, supine yellow band Er, LTR with opposite head turns. cervical isometrics.        Patient will benefit from skilled therapeutic intervention in order to improve the following deficits and impairments:  Pain, Increased fascial restricitons, Increased muscle spasms, Decreased endurance, Decreased activity tolerance, Postural dysfunction, Improper body mechanics, Decreased range of motion  Visit Diagnosis: Cervicalgia  Abnormal posture     Problem List Patient Active Problem List   Diagnosis Date Noted  . Chronic musculoskeletal pain 02/07/2019  . Cachexia (HCC) 02/07/2019  . Erectile dysfunction 10/27/2018  . Pain of right lower extremity 10/27/2018    Sherrie Mustache, PTA 03/03/2019, 10:17 AM  [facility] [address] [place], [place], [address] Phone: [phone]   Fax:  [phone]  Name: Calvin Pittman MRN: [id number] Date of Birth: 26-Mar-1981

## 2019-03-09 ENCOUNTER — Other Ambulatory Visit: Payer: Self-pay

## 2019-03-09 ENCOUNTER — Ambulatory Visit: Payer: Medicaid Other | Admitting: Physical Therapy

## 2019-03-09 ENCOUNTER — Encounter: Payer: Self-pay | Admitting: Physical Therapy

## 2019-03-09 DIAGNOSIS — R293 Abnormal posture: Secondary | ICD-10-CM

## 2019-03-09 DIAGNOSIS — M542 Cervicalgia: Secondary | ICD-10-CM | POA: Diagnosis not present

## 2019-03-09 NOTE — Therapy (Addendum)
[place], [place], [address] Phone: [phone]   Fax:  [phone]  Physical Therapy Treatment / Discharge  Patient Details  Name: Calvin Pittman MRN: [id number] Date of Birth: [birth year] Referring Provider (PT): Scot Jun, FNP   Encounter Date: 03/09/2019  PT End of Session - 03/09/19 1110    Visit Number  9    Number of Visits  13    Date for PT Re-Evaluation  03/22/19    Authorization Type  Medicaid     Authorization Time Period  Second   auth 02/05/2019-03/18/2019    Authorization - Visit Number  5    Authorization - Number of Visits  12    PT Start Time  0805    PT Stop Time  0900    PT Time Calculation (min)  55 min       History reviewed. No pertinent past medical history.  History reviewed. No pertinent surgical history.  There were no vitals filed for this visit.  Subjective Assessment - 03/09/19 0833    Subjective  I felt very good after last visit. Neck still painful.     Currently in Pain?  Yes    Pain Score  4     Pain Location  Neck                       [facility] Adult PT Treatment/Exercise - 03/09/19 0001      Neck Exercises: Supine   Neck Retraction  10 reps;5 secs    Other Supine Exercise  SLR with core brace x 10 each     Other Supine Exercise  bridge x 10 , Marching/dead bug for core stabilization       Shoulder Exercises: Supine   Other Supine Exercises  open books x 5 each way       Shoulder Exercises: Standing   Extension  20 reps    Theraband Level (Shoulder Extension)  Level 3 (Green)    Row  20 reps    Theraband Level (Shoulder Row)  Level 3 (Green)    Other Standing Exercises  standing red band scap stab series, wall push ups, doorway stretch       Shoulder Exercises: Pulleys   Flexion  2 minutes      Shoulder Exercises: ROM/Strengthening   UBE (Upper Arm Bike)  L1 3 min each way       Moist Heat Therapy   Number Minutes Moist  Heat  10 Minutes    Moist Heat Location  Cervical               PT Short Term Goals - 01/26/19 1508      PT SHORT TERM GOAL #1   Title  pt to be I with inital HEP    Time  3    Status  Achieved      PT SHORT TERM GOAL #2   Title  pt to verbalize/ demo proper posture and lifting mechanics to reduce and prevent low back pain     Time  3    Period  Weeks    Status  Achieved        PT Long Term Goals - 02/22/19 1235      PT LONG TERM GOAL #1   Title  increase cervical mobility to >/= 10 degrees in all planes to promote funtional cervical mobility with </= 2/10 pain     Time  6  Period  Weeks    Status  On-going    Target Date  03/22/19      PT LONG TERM GOAL #2   Title  decrease muscle spasm in bil Rhomboids and upper trap to reduce pain and promote functional mobility    Time  6    Period  Weeks    Status  On-going    Target Date  03/22/19      PT LONG TERM GOAL #3   Title  pt to report no pain at night and getting out of bed in the morning for QOL    Time  6    Period  Weeks    Status  On-going    Target Date  03/22/19      PT LONG TERM GOAL #4   Title  to be I with all HEP given as of last visit maintain and progress current level of function .     Baseline  independent with current HEP and progressing as able.     Time  6    Period  Weeks    Status  On-going    Target Date  03/22/19            Plan - 03/09/19 1106    Clinical Impression Statement  Advanced to standing scap stabilization with fatigue but tolerated well. He reports improvement in ability to lay and rise without as much neck pain. Went on a bike ride without pain per his report. He reports he dis have to stop and turn his body to look for traffic due to inabliity to turn head..      PT Next Visit Plan  Need to request extension of mediciad dates next visit; prone scap strength as tolerated ;continue manual in prone position. He wants to add LE strength. update HEP PRN, , upper trap  and surrounding musculature stretching, posture education,     PT Home Exercise Plan  chin tucks, thoracic rotation, upper trap stretch, rhomboid stretch, supine yellow band Er, LTR with opposite head turns. cervical isometrics.     Consulted and Agree with Plan of Care  Patient       Patient will benefit from skilled therapeutic intervention in order to improve the following deficits and impairments:  Pain, Increased fascial restricitons, Increased muscle spasms, Decreased endurance, Decreased activity tolerance, Postural dysfunction, Improper body mechanics, Decreased range of motion  Visit Diagnosis: Cervicalgia  Abnormal posture     Problem List Patient Active Problem List   Diagnosis Date Noted  . Chronic musculoskeletal pain 02/07/2019  . Cachexia ([place]) 02/07/2019  . Erectile dysfunction 10/27/2018  . Pain of right lower extremity 10/27/2018    Dorene Ar, PTA 03/09/2019, 11:11 AM  [place], [place], [address] Phone: [phone]   Fax:  [phone]  Name: Calvin Pittman MRN: [id number] Date of Birth: [birth year]      PHYSICAL THERAPY DISCHARGE SUMMARY  Visits from Start of Care: 9  Current functional level related to goals / functional outcomes: See goals   Remaining deficits: Unknown due to Covid related clinic closure   Education / Equipment: HEP  Plan: Patient agrees to discharge.  Patient goals were not met. Patient is being discharged due to not returning since the last visit.  ?????         Kristoffer Leamon PT, DPT, LAT, ATC  09/06/19  12:54 PM

## 2019-03-27 ENCOUNTER — Ambulatory Visit: Payer: Medicaid Other | Admitting: Physical Medicine & Rehabilitation

## 2019-03-31 ENCOUNTER — Encounter: Payer: Medicaid Other | Admitting: Physical Medicine & Rehabilitation

## 2019-04-20 ENCOUNTER — Encounter: Payer: Medicaid Other | Attending: Physical Medicine & Rehabilitation | Admitting: Physical Medicine & Rehabilitation

## 2019-07-11 ENCOUNTER — Other Ambulatory Visit: Payer: BC Managed Care – PPO

## 2019-07-11 ENCOUNTER — Ambulatory Visit: Payer: Self-pay | Admitting: Family Medicine

## 2019-07-11 ENCOUNTER — Other Ambulatory Visit: Payer: Self-pay

## 2019-07-11 DIAGNOSIS — D500 Iron deficiency anemia secondary to blood loss (chronic): Secondary | ICD-10-CM

## 2019-07-11 DIAGNOSIS — R7989 Other specified abnormal findings of blood chemistry: Secondary | ICD-10-CM

## 2019-07-11 NOTE — Progress Notes (Signed)
Patient here for repeat labs. KWalker, CMA. 

## 2019-07-14 ENCOUNTER — Telehealth: Payer: Self-pay | Admitting: Family Medicine

## 2019-07-14 DIAGNOSIS — D509 Iron deficiency anemia, unspecified: Secondary | ICD-10-CM

## 2019-07-14 DIAGNOSIS — D649 Anemia, unspecified: Secondary | ICD-10-CM

## 2019-07-14 LAB — CBC WITH DIFFERENTIAL/PLATELET
Basophils Absolute: 0 10*3/uL (ref 0.0–0.2)
Basos: 1 %
EOS (ABSOLUTE): 0.2 10*3/uL (ref 0.0–0.4)
Eos: 4 %
Hematocrit: 36.1 % — ABNORMAL LOW (ref 37.5–51.0)
Hemoglobin: 10.6 g/dL — ABNORMAL LOW (ref 13.0–17.7)
Immature Grans (Abs): 0 10*3/uL (ref 0.0–0.1)
Immature Granulocytes: 0 %
Lymphocytes Absolute: 1.1 10*3/uL (ref 0.7–3.1)
Lymphs: 24 %
MCH: 20.8 pg — ABNORMAL LOW (ref 26.6–33.0)
MCHC: 29.4 g/dL — ABNORMAL LOW (ref 31.5–35.7)
MCV: 71 fL — ABNORMAL LOW (ref 79–97)
Monocytes Absolute: 0.6 10*3/uL (ref 0.1–0.9)
Monocytes: 13 %
Neutrophils Absolute: 2.7 10*3/uL (ref 1.4–7.0)
Neutrophils: 58 %
Platelets: 358 10*3/uL (ref 150–450)
RBC: 5.09 x10E6/uL (ref 4.14–5.80)
RDW: 16 % — ABNORMAL HIGH (ref 11.6–15.4)
WBC: 4.6 10*3/uL (ref 3.4–10.8)

## 2019-07-14 LAB — IRON,TIBC AND FERRITIN PANEL
Ferritin: 33 ng/mL (ref 30–400)
Iron Saturation: 6 % — CL (ref 15–55)
Iron: 15 ug/dL — ABNORMAL LOW (ref 38–169)
Total Iron Binding Capacity: 258 ug/dL (ref 250–450)
UIBC: 243 ug/dL (ref 111–343)

## 2019-07-14 LAB — VITAMIN D 25 HYDROXY (VIT D DEFICIENCY, FRACTURES): Vit D, 25-Hydroxy: 14.3 ng/mL — ABNORMAL LOW (ref 30.0–100.0)

## 2019-07-14 MED ORDER — VITAMIN D (ERGOCALCIFEROL) 1.25 MG (50000 UNIT) PO CAPS: 50000.0000 [IU] | ORAL_CAPSULE | ORAL | 2 refills | Status: DC

## 2019-07-14 NOTE — Telephone Encounter (Signed)
Notify patient of the following regarding his recent labs:  Vitamin D is low. I am refilling vitamin D.   His hemoglobin and iron level remains significantly low in spite of prior iron transfusion. Please schedule a second iron infusion at [facility] [place], next available. Please let me know when patient is scheduled and I will place orders. Also given the recurrent anemia, I will refer him to hematology.

## 2019-07-17 NOTE — Telephone Encounter (Addendum)
Attempted to call patient using [employer](Alex [phone]). Voicemail isn't set up.

## 2019-07-18 NOTE — Telephone Encounter (Signed)
Attempted to call patient using [employer]([place] [phone]). Voicemail isn't set up.

## 2019-07-24 NOTE — Telephone Encounter (Signed)
Third attempt to contact patient. Mailed letter.

## 2019-08-14 ENCOUNTER — Telehealth: Payer: Self-pay | Admitting: Oncology

## 2019-08-14 NOTE — Telephone Encounter (Signed)
Received a new hem referral from Calvin Barrows, FNP for dx of IDA. Calvin Pittman cld to schedule an appt. Appt has been scheduled for him to see Calvin Pittman on 9/1 at 11am. I provided the address and location to the pt.

## 2019-08-29 ENCOUNTER — Inpatient Hospital Stay: Payer: BC Managed Care – PPO | Attending: Oncology | Admitting: Oncology

## 2019-08-29 ENCOUNTER — Other Ambulatory Visit: Payer: Self-pay

## 2019-08-29 ENCOUNTER — Inpatient Hospital Stay: Payer: BC Managed Care – PPO

## 2019-08-29 VITALS — BP 96/63 | HR 75 | Temp 98.3°F | Resp 17 | Ht 69.5 in | Wt 103.7 lb

## 2019-08-29 DIAGNOSIS — E559 Vitamin D deficiency, unspecified: Secondary | ICD-10-CM | POA: Insufficient documentation

## 2019-08-29 DIAGNOSIS — D509 Iron deficiency anemia, unspecified: Secondary | ICD-10-CM | POA: Insufficient documentation

## 2019-08-29 DIAGNOSIS — Z79899 Other long term (current) drug therapy: Secondary | ICD-10-CM | POA: Diagnosis not present

## 2019-08-29 DIAGNOSIS — Z791 Long term (current) use of non-steroidal anti-inflammatories (NSAID): Secondary | ICD-10-CM | POA: Insufficient documentation

## 2019-08-29 DIAGNOSIS — R5383 Other fatigue: Secondary | ICD-10-CM | POA: Diagnosis not present

## 2019-08-29 DIAGNOSIS — M791 Myalgia, unspecified site: Secondary | ICD-10-CM | POA: Insufficient documentation

## 2019-08-29 DIAGNOSIS — M2550 Pain in unspecified joint: Secondary | ICD-10-CM | POA: Insufficient documentation

## 2019-08-29 LAB — CBC WITH DIFFERENTIAL (CANCER CENTER ONLY)
Abs Immature Granulocytes: 0.01 10*3/uL (ref 0.00–0.07)
Basophils Absolute: 0 10*3/uL (ref 0.0–0.1)
Basophils Relative: 0 %
Eosinophils Absolute: 0.1 10*3/uL (ref 0.0–0.5)
Eosinophils Relative: 4 %
HCT: 34.9 % — ABNORMAL LOW (ref 39.0–52.0)
Hemoglobin: 10.1 g/dL — ABNORMAL LOW (ref 13.0–17.0)
Immature Granulocytes: 0 %
Lymphocytes Relative: 19 %
Lymphs Abs: 0.7 10*3/uL (ref 0.7–4.0)
MCH: 20 pg — ABNORMAL LOW (ref 26.0–34.0)
MCHC: 28.9 g/dL — ABNORMAL LOW (ref 30.0–36.0)
MCV: 69 fL — ABNORMAL LOW (ref 80.0–100.0)
Monocytes Absolute: 0.6 10*3/uL (ref 0.1–1.0)
Monocytes Relative: 16 %
Neutro Abs: 2.2 10*3/uL (ref 1.7–7.7)
Neutrophils Relative %: 61 %
Platelet Count: 388 10*3/uL (ref 150–400)
RBC: 5.06 MIL/uL (ref 4.22–5.81)
RDW: 19 % — ABNORMAL HIGH (ref 11.5–15.5)
WBC Count: 3.6 10*3/uL — ABNORMAL LOW (ref 4.0–10.5)
nRBC: 0 % (ref 0.0–0.2)

## 2019-08-29 LAB — IRON AND TIBC
Iron: 10 ug/dL — ABNORMAL LOW (ref 42–163)
Saturation Ratios: 4 % — ABNORMAL LOW (ref 20–55)
TIBC: 250 ug/dL (ref 202–409)
UIBC: 240 ug/dL (ref 117–376)

## 2019-08-29 LAB — FERRITIN: Ferritin: 45 ng/mL (ref 24–336)

## 2019-08-29 NOTE — Addendum Note (Signed)
Addended by: Wyatt Portela on: 08/29/2019 02:27 PM   Modules accepted: Orders

## 2019-08-29 NOTE — Progress Notes (Signed)
Reason for the request:    Iron deficiency anemia  HPI: I was asked by Calvin Courts, FNP to evaluate Calvin Pittman for evaluation of iron deficiency anemia.  He is a 38 year old gentleman from the [place] has been living in this area for the last year.  He has a history of vitamin D deficiency and poor nutrition that was diagnosed with iron deficiency December 2019.  At that time his hemoglobin was 10.9 with iron level of 14 and ferritin 105 with iron saturation of 6%.  He was started on oral iron although he was intolerant to it with dyspepsia and stomach discomfort.  He did receive IV iron infusion on December 15, 2018.  He received 510 mg of Feraheme.  Repeat hemoglobin was up to 12.3, iron 35 and improvement saturation to 50%.  Repeat his CBC in July 2020 showed a hemoglobin of 10.6 with iron level down to 14 and saturation of 6% and iron level of 15.  He was referred to me for evaluation regarding recurrent iron deficiency.  He denies any hematochezia, melena or hemoptysis.  He is prescribed oral iron which she has taken it again although not taking it at this time regularly.  He reports he ran out of his medication he has not taken it for the last few weeks.  He does report some fatigue and some tiredness.  Denies any chest pain or shortness of breath.  Denies any recent hospitalizations or illnesses.  He does not report any headaches, blurry vision, syncope or seizures. Does not report any fevers, chills or sweats.  Does not report any cough, wheezing or hemoptysis.  Does not report any chest pain, palpitation, orthopnea or leg edema.  Does not report any nausea, vomiting or abdominal pain.  Does not report any constipation or diarrhea.  Does not report any skeletal complaints.    Does not report frequency, urgency or hematuria.  Does not report any skin rashes or lesions. Does not report any heat or cold intolerance.  Does not report any lymphadenopathy or petechiae.  Does not report any anxiety or  depression.  Remaining review of systems is negative.   Past medical history significant for vitamin D deficiency, allergy and arthritis.   Current Outpatient Medications:  .  albuterol (PROVENTIL HFA;VENTOLIN HFA) 108 (90 Base) MCG/ACT inhaler, Inhale 2 puffs into the lungs every 6 (six) hours as needed for wheezing or shortness of breath., Disp: 1 Inhaler, Rfl: 0 .  cetirizine (ZYRTEC) 10 MG tablet, Take 1 tablet (10 mg total) by mouth daily., Disp: 30 tablet, Rfl: 11 .  diclofenac sodium (VOLTAREN) 1 % GEL, Apply 2 g topically 4 (four) times daily., Disp: 100 g, Rfl: 1 .  ferrous sulfate (FERROUSUL) 325 (65 FE) MG tablet, Take 1 tablet (325 mg total) by mouth 2 (two) times daily with a meal., Disp: 60 tablet, Rfl: 1 .  meloxicam (MOBIC) 15 MG tablet, Take 1 tablet (15 mg total) by mouth daily., Disp: 30 tablet, Rfl: 0 .  omeprazole (PRILOSEC) 40 MG capsule, Take 1 capsule (40 mg total) by mouth daily., Disp: 30 capsule, Rfl: 3 .  sildenafil (VIAGRA) 25 MG tablet, Take 1 tablet (25 mg total) by mouth daily as needed for erectile dysfunction (one hour prior to sexual activity)., Disp: 10 tablet, Rfl: 0 .  Vitamin D, Ergocalciferol, (DRISDOL) 1.25 MG (50000 UT) CAPS capsule, Take 1 capsule (50,000 Units total) by mouth every 7 (seven) days., Disp: 24 capsule, Rfl: 2:  No Known Allergies:  No family history of any malignancy or disorders.  Social History   Socioeconomic History  . Marital status: Married    Spouse name: Not on file  . Number of children: 12  . Years of education: Not on file  . Highest education level: Not on file  Occupational History  . Not on file  Social Needs  . Financial resource strain: Not on file  . Food insecurity    Worry: Not on file    Inability: Not on file  . Transportation needs    Medical: Not on file    Non-medical: Not on file  Tobacco Use  . Smoking status: Never Smoker  . Smokeless tobacco: Never Used  Substance and Sexual Activity  .  Alcohol use: Never    Frequency: Never  . Drug use: Never  . Sexual activity: Yes    Partners: Female  Lifestyle  . Physical activity    Days per week: Not on file    Minutes per session: Not on file  . Stress: Not on file  Relationships  . Social Herbalist on phone: Not on file    Gets together: Not on file    Attends religious service: Not on file    Active member of club or organization: Not on file    Attends meetings of clubs or organizations: Not on file    Relationship status: Not on file  . Intimate partner violence    Fear of current or ex partner: Not on file    Emotionally abused: Not on file    Physically abused: Not on file    Forced sexual activity: Not on file  Other Topics Concern  . Not on file  Social History Narrative  . Not on file  :  Pertinent items are noted in HPI.  Exam: Blood pressure 96/63, pulse 75, temperature 98.3 F (36.8 C), temperature source Oral, resp. rate 17, height 5' 9.5" (1.765 m), weight 103 lb 11.2 oz (47 kg), SpO2 100 %.   ECOG 0 General appearance: alert and cooperative appeared without distress. Head: atraumatic without any abnormalities. Eyes: conjunctivae/corneas clear. PERRL.  Sclera anicteric. Throat: lips, mucosa, and tongue normal; without oral thrush or ulcers. Resp: clear to auscultation bilaterally without rhonchi, wheezes or dullness to percussion. Cardio: regular rate and rhythm, S1, S2 normal, no murmur, click, rub or gallop GI: soft, non-tender; bowel sounds normal; no masses,  no organomegaly Skin: Skin color, texture, turgor normal. No rashes or lesions Lymph nodes: Cervical, supraclavicular, and axillary nodes normal. Neurologic: Grossly normal without any motor, sensory or deep tendon reflexes. Musculoskeletal: No joint deformity or effusion.  CBC    Component Value Date/Time   WBC 4.6 07/11/2019 0922   RBC 5.09 07/11/2019 0922   HGB 10.6 (L) 07/11/2019 0922   HCT 36.1 (L) 07/11/2019 0922    PLT 358 07/11/2019 0922   MCV 71 (L) 07/11/2019 0922   MCH 20.8 (L) 07/11/2019 0922   MCHC 29.4 (L) 07/11/2019 0922   RDW 16.0 (H) 07/11/2019 0922   LYMPHSABS 1.1 07/11/2019 0922   EOSABS 0.2 07/11/2019 0922   BASOSABS 0.0 07/11/2019 0922     Assessment and Plan:   38 year old man with:  1.  Iron deficiency anemia diagnosed in December 2019.  He has been treated with intravenous iron in the past as well as oral iron but has been either poorly tolerated or ineffective.  His most recent CBC in July 2020 showed a hemoglobin of 10.6  with an MCV of 71 and elevated RDW.  Iron levels are 15 with saturation of 6%.  His hemoglobin electrophoresis did not show any thalassemia.  The etiology of his iron deficiency is unclear predominantly related to poor nutritional intake and possible absorption.  I have recommended intravenous iron given his poor iron absorption.  Risks and benefits of Feraheme infusion was reviewed again.  He is complications including arthralgias, myalgias and infusion related complications.  Plan is to update his iron levels and proceed with intravenous iron in the near future.  He is agreeable with this plan.  2.  Follow-up: Will be determined based on the results of her iron studies and infusion.  40  minutes was spent with the patient face-to-face today.  More than 50% of time was spent on reviewing laboratory data, treatment options and complications related therapy.      Thank you for the referral. A copy of this consult has been forwarded to the requesting physician.

## 2019-08-30 ENCOUNTER — Telehealth: Payer: Self-pay | Admitting: Oncology

## 2019-08-30 NOTE — Telephone Encounter (Signed)
Mailed printout per patient request

## 2019-09-12 ENCOUNTER — Inpatient Hospital Stay: Payer: BC Managed Care – PPO

## 2019-09-12 ENCOUNTER — Other Ambulatory Visit: Payer: Self-pay

## 2019-09-12 VITALS — BP 93/68 | HR 69 | Temp 98.1°F | Resp 20

## 2019-09-12 DIAGNOSIS — D509 Iron deficiency anemia, unspecified: Secondary | ICD-10-CM

## 2019-09-12 MED ORDER — SODIUM CHLORIDE 0.9 % IV SOLN
Freq: Once | INTRAVENOUS | Status: AC
  Administered 2019-09-12: 14:00:00 via INTRAVENOUS
  Filled 2019-09-12: qty 250

## 2019-09-12 MED ORDER — SODIUM CHLORIDE 0.9 % IV SOLN
510.0000 mg | Freq: Once | INTRAVENOUS | Status: AC
  Administered 2019-09-12: 510 mg via INTRAVENOUS
  Filled 2019-09-12: qty 510

## 2019-09-12 NOTE — Patient Instructions (Signed)

## 2019-09-19 ENCOUNTER — Other Ambulatory Visit: Payer: Self-pay

## 2019-09-19 ENCOUNTER — Inpatient Hospital Stay: Payer: BC Managed Care – PPO

## 2019-09-19 ENCOUNTER — Ambulatory Visit: Payer: BC Managed Care – PPO

## 2019-09-19 VITALS — BP 100/66 | HR 73 | Temp 98.2°F | Resp 18

## 2019-09-19 DIAGNOSIS — D509 Iron deficiency anemia, unspecified: Secondary | ICD-10-CM | POA: Diagnosis not present

## 2019-09-19 MED ORDER — SODIUM CHLORIDE 0.9 % IV SOLN
Freq: Once | INTRAVENOUS | Status: AC
  Administered 2019-09-19: 14:00:00 via INTRAVENOUS
  Filled 2019-09-19: qty 250

## 2019-09-19 MED ORDER — SODIUM CHLORIDE 0.9 % IV SOLN
510.0000 mg | Freq: Once | INTRAVENOUS | Status: AC
  Administered 2019-09-19: 14:00:00 510 mg via INTRAVENOUS
  Filled 2019-09-19: qty 17

## 2019-09-19 NOTE — Patient Instructions (Signed)

## 2019-09-23 IMAGING — DX DG CERVICAL SPINE COMPLETE 4+V
5 series · 5 of 5 positions shown · non-contrast
Comparison: None.

CLINICAL DATA: Neck pain x3 weeks without known injury.

EXAM:
CERVICAL SPINE - COMPLETE 4+ VIEW

[cervical spine ap]
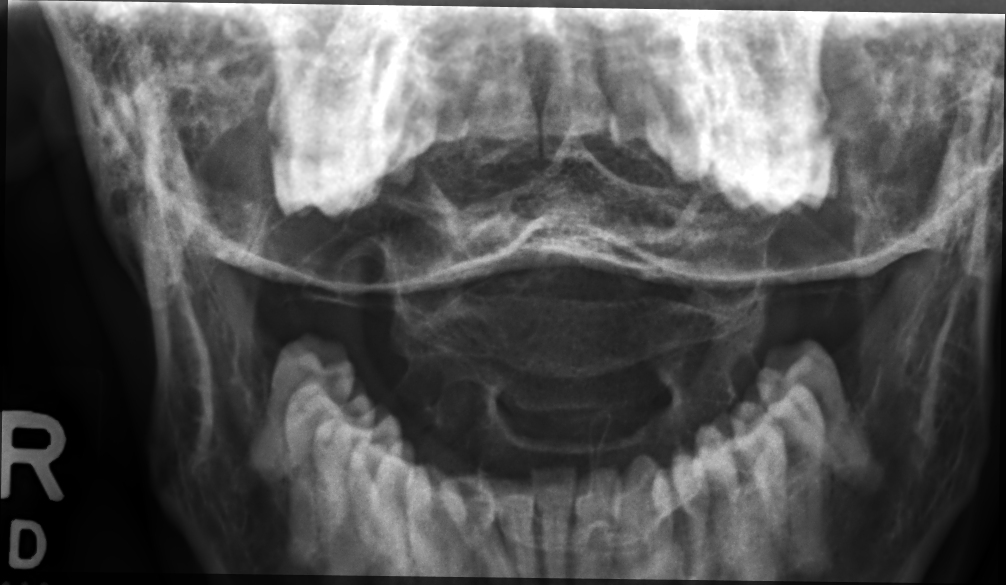

[cervical spine lat]
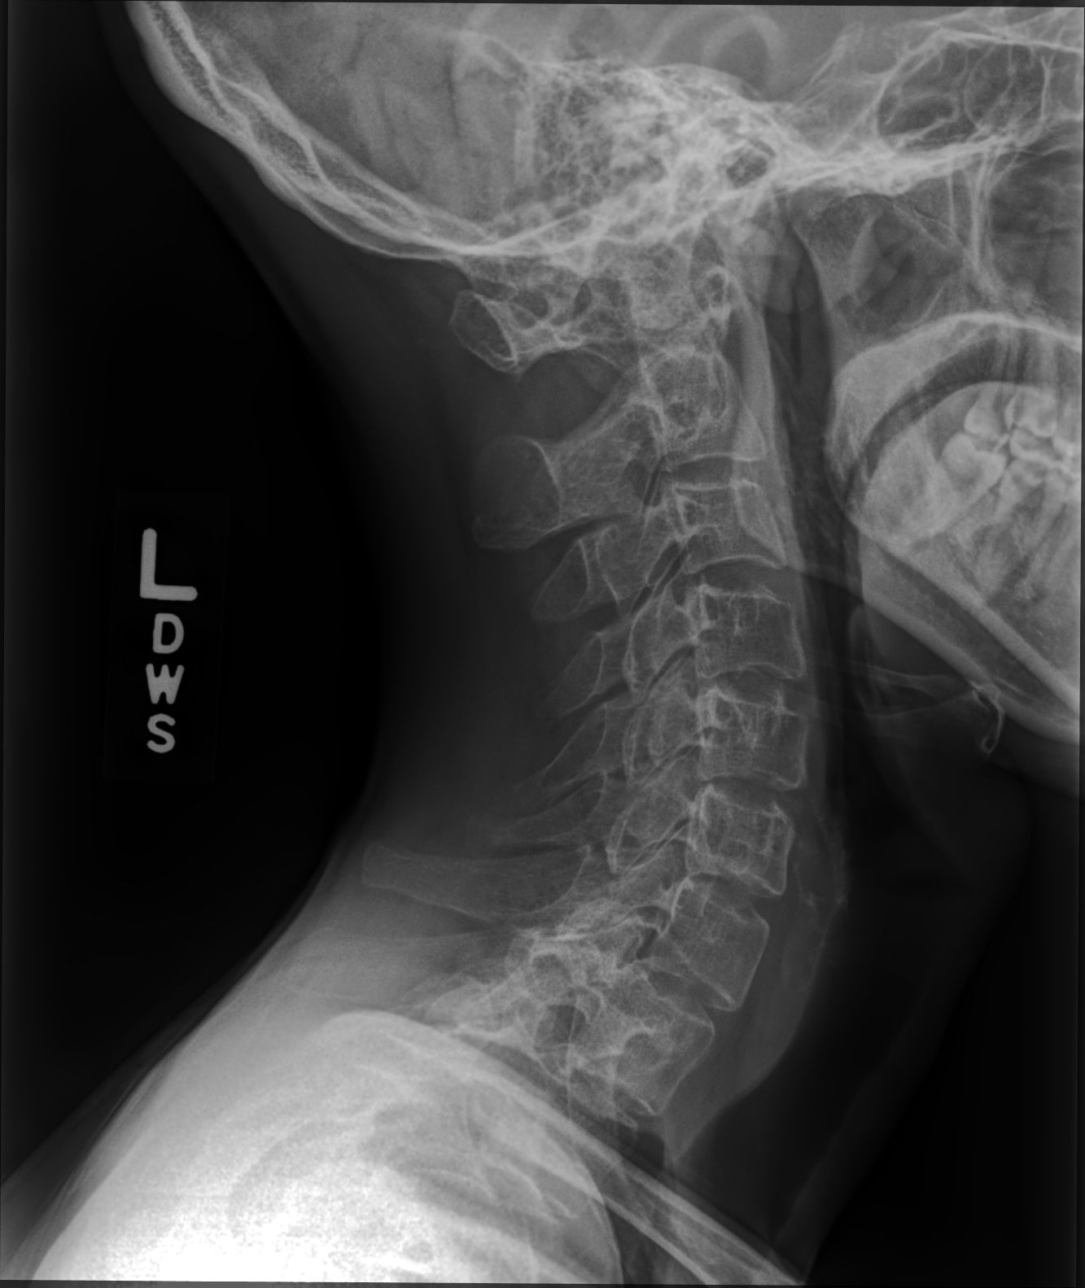

[cervical spine oblique (1 of 2)]
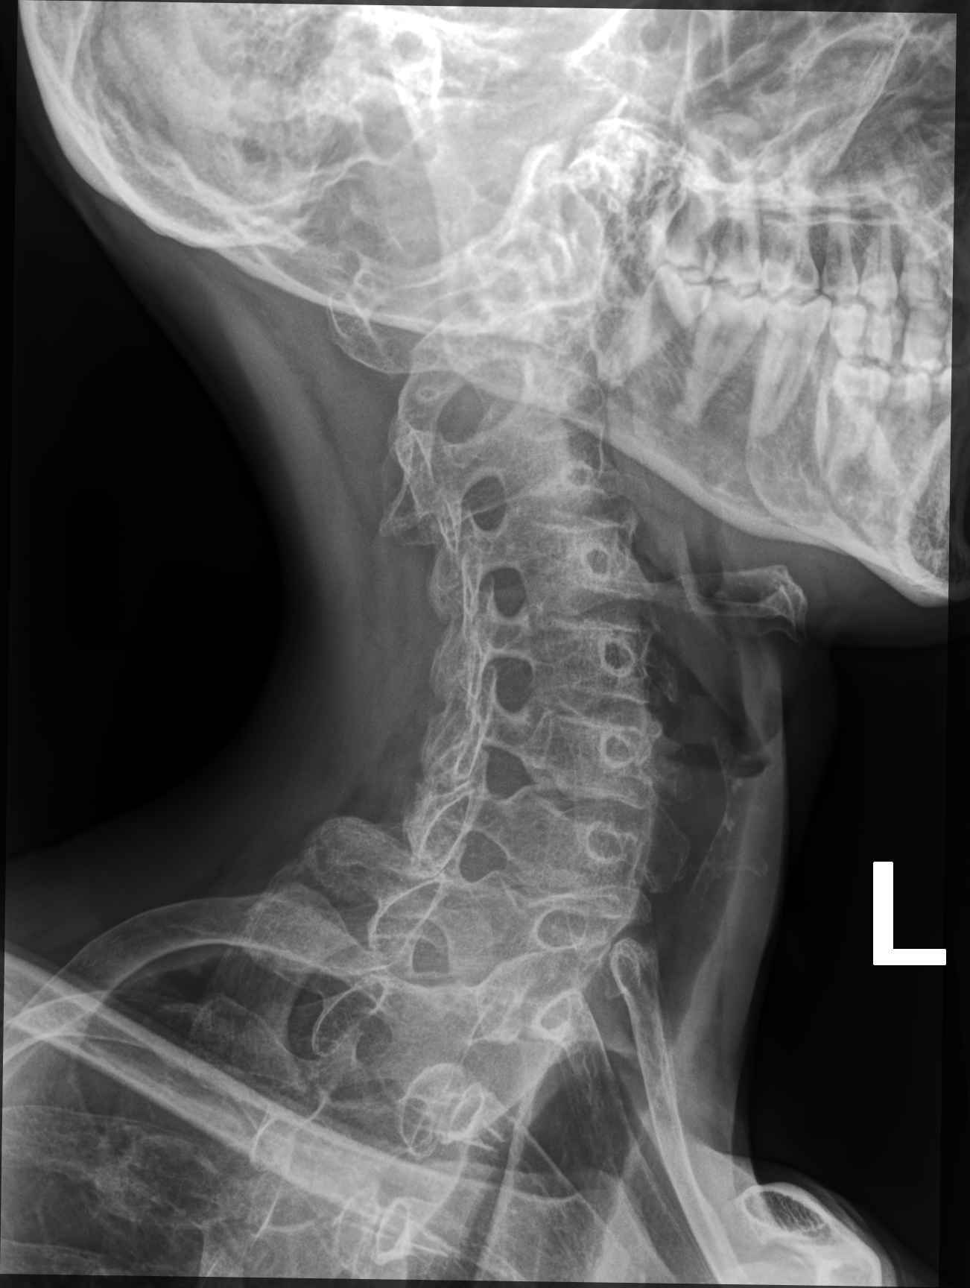

[cervical spine oblique (2 of 2)]
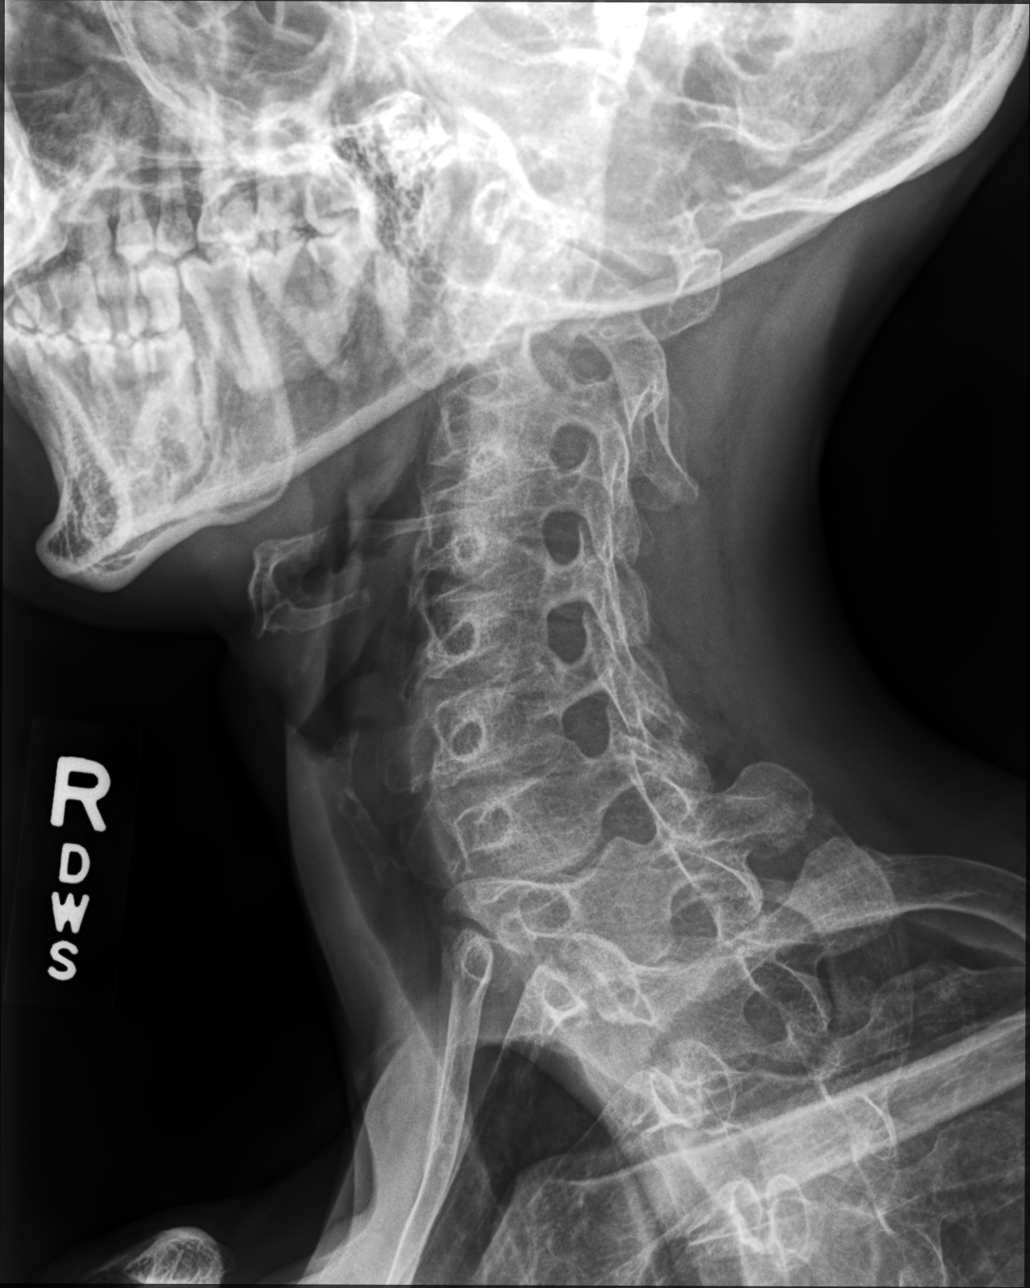

[odontoid process (dens)]
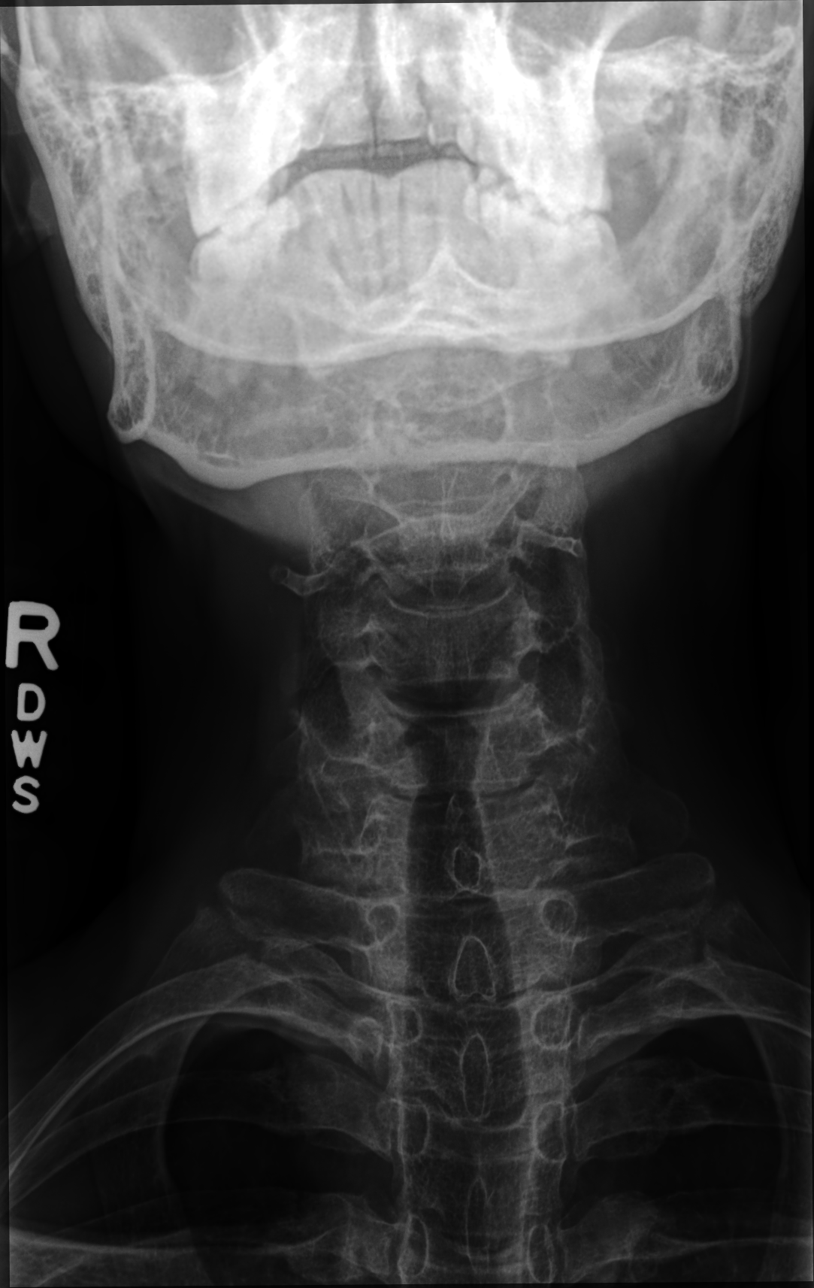

[5 of 5 positions shown; findings below may reference images not displayed]

FINDINGS: Maintained cervical lordosis and intact atlantodental interval. No
prevertebral soft tissue swelling is identified. Disc spaces are
maintained. Joint space narrowing sclerosis of the C7-T1 facets. No
significant neural foraminal encroachment. No jumped or perched
facet. No acute cervical spine fracture or suspicious osseous
lesions
IMPRESSION: C7-T1 facet arthropathy. No acute osseous abnormality.

## 2020-09-26 DIAGNOSIS — R7982 Elevated C-reactive protein (CRP): Secondary | ICD-10-CM | POA: Insufficient documentation

## 2020-09-26 DIAGNOSIS — R700 Elevated erythrocyte sedimentation rate: Secondary | ICD-10-CM | POA: Insufficient documentation

## 2020-09-30 DIAGNOSIS — R195 Other fecal abnormalities: Secondary | ICD-10-CM | POA: Insufficient documentation

## 2020-09-30 DIAGNOSIS — M459 Ankylosing spondylitis of unspecified sites in spine: Secondary | ICD-10-CM | POA: Insufficient documentation

## 2020-10-31 ENCOUNTER — Ambulatory Visit: Payer: BC Managed Care – PPO | Admitting: Gastroenterology

## 2020-11-19 ENCOUNTER — Ambulatory Visit: Payer: BC Managed Care – PPO | Admitting: Gastroenterology

## 2022-08-27 ENCOUNTER — Encounter (HOSPITAL_COMMUNITY): Payer: Self-pay

## 2022-08-27 ENCOUNTER — Ambulatory Visit (HOSPITAL_COMMUNITY)
Admission: EM | Admit: 2022-08-27 | Discharge: 2022-08-27 | Disposition: A | Discharge status: Home / Self Care | Payer: BC Managed Care – PPO | Attending: Family | Admitting: Family

## 2022-08-27 ENCOUNTER — Encounter: Payer: Self-pay | Admitting: Oncology

## 2022-08-27 DIAGNOSIS — R112 Nausea with vomiting, unspecified: Secondary | ICD-10-CM

## 2022-08-27 DIAGNOSIS — R1084 Generalized abdominal pain: Secondary | ICD-10-CM

## 2022-08-27 DIAGNOSIS — Z8719 Personal history of other diseases of the digestive system: Secondary | ICD-10-CM

## 2022-08-27 DIAGNOSIS — M542 Cervicalgia: Secondary | ICD-10-CM

## 2022-08-27 DIAGNOSIS — K1379 Other lesions of oral mucosa: Secondary | ICD-10-CM

## 2022-08-27 HISTORY — DX: Crohn's disease, unspecified, without complications: K50.90

## 2022-08-27 MED ORDER — LIDOCAINE VISCOUS HCL 2 % MT SOLN: 10.0000 mL | OROMUCOSAL | 0 refills | Status: DC | PRN

## 2022-08-27 MED ORDER — PREDNISONE 10 MG PO TABS: ORAL_TABLET | ORAL | 0 refills | Status: DC

## 2022-08-27 NOTE — Discharge Instructions (Addendum)
Recommend start Prednisoned 40mg  daily for 5 days then 30mg  daily for 5 days then 20mg  daily for 5 days then 10mg  daily until finished. Call your Primary Care provider to schedule appointment for follow-up.

## 2022-08-27 NOTE — ED Triage Notes (Signed)
Pt c/o generalized abdominal cramping for over a wk and worse today. States hx of same and was given meds that helped but is currently out. States vomit x1 today.

## 2022-08-27 NOTE — ED Provider Notes (Signed)
[facility]    CSN: [id number] Arrival date & time: 08/27/22  1656      History   Chief Complaint Chief Complaint  Patient presents with   Abdominal Pain    HPI Calvin Pittman is a 41 y.o. male.   41 year old male presents with generalized abdominal pain and cramping for the past week. The pain became worse today and he experienced some nausea and vomited up food once today. He denies any distinct fever, upper respiratory symptoms, or urinary symptoms or penile discharge. He does experience some loose stools. He also has noticed a sore in his lower gum of his mouth for the past 2 weeks- occasional pain. He has a history of Crohn's disease and has seen his PCP, and GI specialist. He was placed on infusions of Remicade every 2 months which significantly helped control symptoms. He lost his insurance in June and his last treatment was in May 2023. He is unable to afford to see his GI specialist or receive Remicade infusions again at this time. He has seen his PCP in the past and last flare up of Crohn's, he was placed on Prednisone for almost 2 months. Other chronic health issues include Iron deficiency anemia, environmental allergies and history of latent TB. He is not currently taking any medications except oral Iron and vit D.   The history is provided by the patient. The history is limited by a language barrier. A language interpreter was used Electronics engineer interpreter used- still very difficult interaction understanding patient.).    Past Medical History:  Diagnosis Date   Crohn disease [facility])     Patient Active Problem List   Diagnosis Date Noted   Ankylosing spondylitis (HCC) 09/30/2020   Heme positive stool 09/30/2020   Elevated C-reactive protein (CRP) 09/26/2020   Elevated sed rate 09/26/2020   Iron deficiency anemia 08/29/2019   Chronic musculoskeletal pain 02/07/2019   Cachexia (HCC) 02/07/2019   Erectile dysfunction 10/27/2018   Pain of right  lower extremity 10/27/2018    History reviewed. No pertinent surgical history.     Home Medications    Prior to Admission medications   Medication Sig Start Date End Date Taking? Authorizing Provider  lidocaine (XYLOCAINE) 2 % solution Use as directed 10 mLs in the mouth or throat every 4 (four) hours as needed for mouth pain. 08/27/22  Yes Emelda Kohlbeck, Ali Lowe, NP  predniSONE (DELTASONE) 10 MG tablet Take 4 tablets by mouth once daily for 5 days then 3 tablet daily for the next 5 days then 2 tablets daily for the next 5 days and then 1 tablet daily until finished. 08/27/22  Yes Abella Shugart, Ali Lowe, NP  albuterol (PROVENTIL HFA;VENTOLIN HFA) 108 (90 Base) MCG/ACT inhaler Inhale 2 puffs into the lungs every 6 (six) hours as needed for wheezing or shortness of breath. 02/07/19   Bing Neighbors, FNP  cetirizine (ZYRTEC) 10 MG tablet Take 1 tablet (10 mg total) by mouth daily. 12/02/18   Bing Neighbors, FNP  ferrous sulfate (FERROUSUL) 325 (65 FE) MG tablet Take 1 tablet (325 mg total) by mouth 2 (two) times daily with a meal. 10/21/18   Bing Neighbors, FNP  omeprazole (PRILOSEC) 40 MG capsule Take 1 capsule (40 mg total) by mouth daily. 10/14/18   Bing Neighbors, FNP  Vitamin D, Ergocalciferol, (DRISDOL) 1.25 MG (50000 UT) CAPS capsule Take 1 capsule (50,000 Units total) by mouth every 7 (seven) days. 07/14/19   Bing Neighbors, FNP  Family History History reviewed. No pertinent family history.  Social History Social History   Tobacco Use   Smoking status: Never   Smokeless tobacco: Never  Substance Use Topics   Alcohol use: Never   Drug use: Never     Allergies   Ibuprofen   Review of Systems Review of Systems  Constitutional:  Positive for activity change, appetite change and fatigue. Negative for chills, diaphoresis and fever.  HENT:  Positive for mouth sores. Negative for congestion, facial swelling, nosebleeds, rhinorrhea, sinus pressure, sinus pain, sore throat  and trouble swallowing.   Respiratory:  Negative for cough, chest tightness and shortness of breath.   Gastrointestinal:  Positive for abdominal pain, nausea and vomiting. Negative for blood in stool and constipation.  Genitourinary:  Negative for decreased urine volume, difficulty urinating, dysuria, flank pain, hematuria, penile discharge, penile pain, penile swelling and urgency.  Musculoskeletal:  Positive for arthralgias, back pain and neck pain.  Skin:  Negative for color change and rash.  Allergic/Immunologic: Positive for environmental allergies and immunocompromised state. Negative for food allergies.  Neurological:  Negative for dizziness, tremors, seizures, syncope, speech difficulty, numbness and headaches.  Hematological:  Negative for adenopathy. Does not bruise/bleed easily.     Physical Exam Triage Vital Signs ED Triage Vitals  Enc Vitals Group     BP 08/27/22 1727 116/70     Pulse Rate 08/27/22 1727 83     Resp 08/27/22 1727 18     Temp 08/27/22 1727 98.9 F (37.2 C)     Temp Source 08/27/22 1727 Oral     SpO2 08/27/22 1727 98 %     Weight --      Height --      Head Circumference --      Peak Flow --      Pain Score 08/27/22 1729 8     Pain Loc --      Pain Edu? --      Excl. in GC? --    No data found.  Updated Vital Signs BP 116/70 (BP Location: Left Arm)   Pulse 83   Temp 98.9 F (37.2 C) (Oral)   Resp 18   SpO2 98%   Visual Acuity Right Eye Distance:   Left Eye Distance:   Bilateral Distance:    Right Eye Near:   Left Eye Near:    Bilateral Near:     Physical Exam Vitals and nursing note reviewed.  Constitutional:      General: He is awake. He is not in acute distress.    Appearance: He is well-groomed and underweight.     Comments: He is sitting on the exam table in no acute distress.   HENT:     Head: Normocephalic and atraumatic.     Right Ear: Hearing normal.     Left Ear: Hearing normal.     Nose: Nose normal. No congestion.      Right Sinus: No maxillary sinus tenderness or frontal sinus tenderness.     Left Sinus: No maxillary sinus tenderness or frontal sinus tenderness.     Mouth/Throat:     Lips: Pink.     Mouth: Mucous membranes are moist.     Dentition: Abnormal dentition. Gum lesions present.     Tongue: No lesions.     Pharynx: Oropharynx is clear. Uvula midline.      Comments: About 20mm round oral lesion present at base of gum on central lower aspect below front teeth. Red, inflamed with  white area. No bleeding. Slightly tender. No discharge. Various areas of lower gumline appear irritated. Some teeth in poor repair.  Eyes:     Extraocular Movements: Extraocular movements intact.     Conjunctiva/sclera: Conjunctivae normal.  Neck:      Comments: Has full range of motion of neck but pain with flexion and extension, mainly along left trapezius muscle group. No swelling. No rash. No neuro deficits noted.  Cardiovascular:     Rate and Rhythm: Normal rate and regular rhythm.     Heart sounds: Normal heart sounds. No murmur heard. Pulmonary:     Effort: Pulmonary effort is normal. No respiratory distress.     Breath sounds: Normal breath sounds and air entry. No decreased breath sounds, wheezing or rhonchi.  Abdominal:     General: Abdomen is flat. Bowel sounds are normal.     Palpations: Abdomen is soft. There is no hepatomegaly or splenomegaly.     Tenderness: There is generalized abdominal tenderness and tenderness in the right upper quadrant and right lower quadrant. There is no right CVA tenderness, left CVA tenderness, guarding or rebound.  Musculoskeletal:     Cervical back: Normal range of motion and neck supple. Tenderness present. No rigidity. Pain with movement and muscular tenderness present. Normal range of motion.  Lymphadenopathy:     Cervical: No cervical adenopathy.  Skin:    General: Skin is warm and dry.     Capillary Refill: Capillary refill takes less than 2 seconds.     Findings: No  rash.  Neurological:     General: No focal deficit present.     Mental Status: He is alert and oriented to person, place, and time.  Psychiatric:        Attention and Perception: Attention normal.        Mood and Affect: Mood normal.        Speech: Speech is delayed.        Behavior: Behavior is cooperative.      UC Treatments / Results  Labs (all labs ordered are listed, but only abnormal results are displayed) Labs Reviewed - No data to display  EKG   Radiology No results found.  Procedures Procedures (including critical care time)  Medications Ordered in UC Medications - No data to display  Initial Impression / Assessment and Plan / UC Course  I have reviewed the triage vital signs and the nursing notes.  Pertinent labs & imaging results that were available during my care of the patient were reviewed by me and considered in my medical decision making (see chart for details).     Still some difficulty obtaining history and explaining exam findings and treatment plan due to language barrier.  Since patient's symptoms are similar to previous episodes of flare up of his Crohn's disease, will treat with oral Prednisone at this time until he can see his PCP again and see if he can get assistance in restarting Remicade. Will start on Prednisone 40mg  for 5 days then 30mg  for 5 days then 20mg  for 5 days then 10mg  for 5 days. This may help his mouth sores but he may need additional evaluation or medication. He may trial Viscous lidocaine- apply to mouth sores every 3 to 4 hours as needed for comfort. Discussed that his neck pain appears musculoskeletal in etiology- Prednisone may also help with this. No emergent symptoms present at this time and patient is stable with normal vitals. Do not feel blood work is needed at this  time. Recommend patient call his PCP to schedule appointment for further evaluation. Again discussed that his PCP may help patient to apply for patient assistance  programs to help pay for Remicade for this patient. Patient understands and will call his PCP and follow-up with his PCP as discussed.  Final Clinical Impressions(s) / UC Diagnoses   Final diagnoses:  Generalized abdominal pain  Nausea and vomiting, unspecified vomiting type  History of Crohn's disease  Mouth sores  Neck pain on left side     Discharge Instructions      Recommend start Prednisoned 40mg  daily for 5 days then 30mg  daily for 5 days then 20mg  daily for 5 days then 10mg  daily until finished. Call your Primary Care provider to schedule appointment for follow-up.     ED Prescriptions     Medication Sig Dispense Auth. Provider   predniSONE (DELTASONE) 10 MG tablet Take 4 tablets by mouth once daily for 5 days then 3 tablet daily for the next 5 days then 2 tablets daily for the next 5 days and then 1 tablet daily until finished. 50 tablet Kensley Lares, , NP   lidocaine (XYLOCAINE) 2 % solution Use as directed 10 mLs in the mouth or throat every 4 (four) hours as needed for mouth pain. 100 mL , NP      PDMP not reviewed this encounter.   , NP 08/28/22 2251

## 2022-08-28 ENCOUNTER — Encounter (HOSPITAL_COMMUNITY): Payer: Self-pay | Admitting: Family

## 2024-01-10 ENCOUNTER — Other Ambulatory Visit: Payer: Self-pay

## 2024-01-10 ENCOUNTER — Emergency Department (HOSPITAL_COMMUNITY): Payer: Medicaid Other

## 2024-01-10 ENCOUNTER — Emergency Department (HOSPITAL_COMMUNITY)
Admission: EM | Admit: 2024-01-10 | Discharge: 2024-01-10 | Disposition: A | Discharge status: Home / Self Care | Payer: Medicaid Other | Attending: Emergency Medicine | Admitting: Emergency Medicine

## 2024-01-10 ENCOUNTER — Encounter: Payer: Self-pay | Admitting: Oncology

## 2024-01-10 ENCOUNTER — Encounter (HOSPITAL_COMMUNITY): Payer: Self-pay | Admitting: Emergency Medicine

## 2024-01-10 DIAGNOSIS — S62114A Nondisplaced fracture of triquetrum [cuneiform] bone, right wrist, initial encounter for closed fracture: Secondary | ICD-10-CM | POA: Diagnosis not present

## 2024-01-10 DIAGNOSIS — W009XXA Unspecified fall due to ice and snow, initial encounter: Secondary | ICD-10-CM | POA: Insufficient documentation

## 2024-01-10 DIAGNOSIS — M25531 Pain in right wrist: Secondary | ICD-10-CM | POA: Diagnosis present

## 2024-01-10 MED ORDER — OXYCODONE-ACETAMINOPHEN 5-325 MG PO TABS
1.0000 | ORAL_TABLET | Freq: Once | ORAL | Status: AC
  Administered 2024-01-10: 1 via ORAL
  Filled 2024-01-10: qty 1

## 2024-01-10 MED ORDER — OXYCODONE-ACETAMINOPHEN 5-325 MG PO TABS: 1.0000 | ORAL_TABLET | Freq: Three times a day (TID) | ORAL | 0 refills | Status: DC | PRN

## 2024-01-10 NOTE — ED Provider Notes (Signed)
  EMERGENCY DEPARTMENT AT [facility] Provider Note   CSN: [id number] Arrival date & time: 01/10/24  0247     History  Chief Complaint  Patient presents with   Arm Pain    Calvin Pittman is a 43 y.o. male.  43 y/o male presenting for R wrist pain after mechanical fall on ice landing on outstretched hand. No numbness or weakness. Swelling noted.  The history is provided by the patient. No language interpreter was used.  Arm Pain This is a new problem. The current episode started 6 to 12 hours ago. The problem occurs constantly. The problem has not changed since onset.The symptoms are aggravated by bending. Nothing relieves the symptoms. He has tried rest for the symptoms. The treatment provided no relief.       Home Medications Prior to Admission medications   Medication Sig Start Date End Date Taking? Authorizing Provider  oxyCODONE -acetaminophen  (PERCOCET/ROXICET) 5-325 MG tablet Take 1 tablet by mouth every 8 (eight) hours as needed for severe pain (pain score 7-10). 01/10/24  Yes Keith Sor, PA-C  albuterol  (PROVENTIL  HFA;VENTOLIN  HFA) 108 (90 Base) MCG/ACT inhaler Inhale 2 puffs into the lungs every 6 (six) hours as needed for wheezing or shortness of breath. 02/07/19   Arloa Suzen RAMAN, NP  cetirizine  (ZYRTEC ) 10 MG tablet Take 1 tablet (10 mg total) by mouth daily. 12/02/18   Arloa Suzen RAMAN, NP  ferrous sulfate  (FERROUSUL) 325 (65 FE) MG tablet Take 1 tablet (325 mg total) by mouth 2 (two) times daily with a meal. 10/21/18   Arloa Suzen RAMAN, NP  lidocaine  (XYLOCAINE ) 2 % solution Use as directed 10 mLs in the mouth or throat every 4 (four) hours as needed for mouth pain. 08/27/22   Pearl Jenkins Lesches, NP  omeprazole  (PRILOSEC) 40 MG capsule Take 1 capsule (40 mg total) by mouth daily. 10/14/18   Arloa Suzen RAMAN, NP  predniSONE  (DELTASONE ) 10 MG tablet Take 4 tablets by mouth once daily for 5 days then 3 tablet daily for the next 5 days then 2  tablets daily for the next 5 days and then 1 tablet daily until finished. 08/27/22   Pearl Jenkins Lesches, NP  Vitamin D , Ergocalciferol , (DRISDOL ) 1.25 MG (50000 UT) CAPS capsule Take 1 capsule (50,000 Units total) by mouth every 7 (seven) days. 07/14/19   Arloa Suzen RAMAN, NP      Allergies    Ibuprofen    Review of Systems   Review of Systems Ten systems reviewed and are negative for acute change, except as noted in the HPI.    Physical Exam Updated Vital Signs BP 122/70   Pulse 80   Temp 98.6 F (37 C)   Resp 16   Ht 6' (1.829 m)   SpO2 98%   BMI 14.06 kg/m   Physical Exam Vitals and nursing note reviewed.  Constitutional:      General: He is not in acute distress.    Appearance: He is well-developed. He is not diaphoretic.     Comments: Nontoxic appearing and in NAD  HENT:     Head: Normocephalic and atraumatic.  Eyes:     General: No scleral icterus.    Conjunctiva/sclera: Conjunctivae normal.  Cardiovascular:     Rate and Rhythm: Normal rate and regular rhythm.     Pulses: Normal pulses.     Comments: Distal radial pulse 2+ in the RUE Pulmonary:     Effort: Pulmonary effort is normal. No respiratory distress.  Comments: Respirations even and unlabored Musculoskeletal:     Cervical back: Normal range of motion.     Comments: Swelling of the R wrist with TTP along the ulnar aspect of the wrist. No crepitus, deformity.  Skin:    General: Skin is warm and dry.     Coloration: Skin is not pale.     Findings: No erythema or rash.  Neurological:     Mental Status: He is alert and oriented to person, place, and time.     Coordination: Coordination normal.     Comments: Sensation intact to all digits of the R hand.  Psychiatric:        Behavior: Behavior normal.     ED Results / Procedures / Treatments   Labs (all labs ordered are listed, but only abnormal results are displayed) Labs Reviewed - No data to display  EKG None  Radiology DG Wrist Complete  Right Result Date: 01/10/2024 CLINICAL DATA:  Status post fall. EXAM: RIGHT WRIST - COMPLETE 3+ VIEW COMPARISON:  None Available. FINDINGS: Acute nondisplaced fracture deformity is seen along the dorsal aspect of the right wrist on the lateral view. This appears to involve the dorsal aspect of the triquetrum bone. There is no evidence of dislocation. There is no evidence of arthropathy or other focal bone abnormality. Mild soft tissue swelling is seen along the dorsal aspect of the right wrist. IMPRESSION: Acute nondisplaced fracture of the dorsal aspect of the right wrist, likely involving the triquetrum bone. Electronically Signed   By: Suzen Dials M.D.   On: 01/10/2024 03:46   DG Forearm Right Result Date: 01/10/2024 CLINICAL DATA:  Status post fall. EXAM: RIGHT FOREARM - 2 VIEW COMPARISON:  None Available. FINDINGS: There is no evidence of fracture or other focal bone lesions. Soft tissues are unremarkable. IMPRESSION: Negative. Electronically Signed   By: Suzen Dials M.D.   On: 01/10/2024 03:41    Procedures Procedures    Medications Ordered in ED Medications  oxyCODONE -acetaminophen  (PERCOCET/ROXICET) 5-325 MG per tablet 1 tablet (1 tablet Oral Given 01/10/24 0406)    ED Course/ Medical Decision Making/ A&P                                 Medical Decision Making Amount and/or Complexity of Data Reviewed Radiology: ordered.  Risk Prescription drug management.   This patient presents to the ED for concern of R wrist pain, this involves an extensive number of treatment options, and is a complaint that carries with it a high risk of complications and morbidity.  The differential diagnosis includes fracture vs dislocation vs sprain vs septic joint.   Co morbidities that complicate the patient evaluation  Crohn's disease   Additional history obtained:  Additional history obtained from significant other, at bedside   Imaging Studies ordered:  I ordered imaging  studies including Xray R wrist and R forearm  I independently visualized and interpreted imaging which showed findings c/w acute fracture of the right triquetrum. I agree with the radiologist interpretation   Cardiac Monitoring:  The patient was maintained on a cardiac monitor.  I personally viewed and interpreted the cardiac monitored which showed an underlying rhythm of: NSR   Medicines ordered and prescription drug management:  I ordered medication including percocet for pain  Reevaluation of the patient after these medicines showed that the patient improved I have reviewed the patients home medicines and have made adjustments as needed  Test Considered:  CT R wrist   Problem List / ED Course:  43  y/o male presenting with R wrist pain after mechanical fall. Neurovascularly intact. Xray with evidence of triquetrum fracture. Placed in volar short arm splint. Given arm sling to elevate extremity. Referral provided to orthopedic hand surgery.   Reevaluation:  After the interventions noted above, I reevaluated the patient and found that they have :stayed the same   Social Determinants of Health:  Language barrier   Dispostion:  After consideration of the diagnostic results and the patients response to treatment, I feel that the patent would benefit from maintenance of volar splint pending Orthopedic f/u. Referral given. Return precautions discussed and provided. Patient discharged in stable condition with no unaddressed concerns.          Final Clinical Impression(s) / ED Diagnoses Final diagnoses:  Closed nondisplaced fracture of triquetrum of right wrist, initial encounter    Rx / DC Orders ED Discharge Orders          Ordered    oxyCODONE -acetaminophen  (PERCOCET/ROXICET) 5-325 MG tablet  Every 8 hours PRN        01/10/24 0411              Keith Sor, PA-C 01/10/24 0521    Theadore Ozell HERO, MD 01/10/24 [phone]

## 2024-01-10 NOTE — Progress Notes (Signed)
 Orthopedic Tech Progress Note Patient Details:  Calvin Pittman 05-23-1981 [id number]  Ortho Devices Type of Ortho Device: Volar splint, Arm sling Ortho Device/Splint Location: rue Ortho Device/Splint Interventions: Ordered, Application, Adjustment  I applied the splint to the patient with some pain. The patient did well. Post Interventions Patient Tolerated: Well Instructions Provided: Care of device, Adjustment of device  Chandra Dorn PARAS 01/10/2024, 4:53 AM

## 2024-01-10 NOTE — ED Triage Notes (Signed)
 Pt slipped on the ice and fell onto his right hand causing pain to his right wrist and arm. No other complaints

## 2024-01-10 NOTE — Discharge Instructions (Addendum)
 Keep your right arm elevated as much as possible and ice your injury 3-4 times per day. Take Percocet as prescribed for severe pain. Do not drive or drink alcohol after taking his medication as it may make you drowsy and impair your judgement. Call to schedule a follow up appointment with Orthopedic hand surgery. Return for new or concerning symptoms.

## 2024-05-25 ENCOUNTER — Inpatient Hospital Stay (HOSPITAL_COMMUNITY)
Admission: EM | Admit: 2024-05-25 | Discharge: 2024-05-28 | DRG: 387 | Disposition: A | Discharge status: Home / Self Care | Attending: Internal Medicine | Admitting: Internal Medicine

## 2024-05-25 ENCOUNTER — Other Ambulatory Visit: Payer: Self-pay

## 2024-05-25 DIAGNOSIS — K50119 Crohn's disease of large intestine with unspecified complications: Principal | ICD-10-CM

## 2024-05-25 DIAGNOSIS — D72819 Decreased white blood cell count, unspecified: Secondary | ICD-10-CM | POA: Diagnosis present

## 2024-05-25 DIAGNOSIS — M456 Ankylosing spondylitis lumbar region: Secondary | ICD-10-CM | POA: Diagnosis present

## 2024-05-25 DIAGNOSIS — Z5971 Insufficient health insurance coverage: Secondary | ICD-10-CM

## 2024-05-25 DIAGNOSIS — E611 Iron deficiency: Secondary | ICD-10-CM | POA: Diagnosis present

## 2024-05-25 DIAGNOSIS — R1031 Right lower quadrant pain: Secondary | ICD-10-CM

## 2024-05-25 DIAGNOSIS — Z8615 Personal history of latent tuberculosis infection: Secondary | ICD-10-CM

## 2024-05-25 DIAGNOSIS — K50919 Crohn's disease, unspecified, with unspecified complications: Secondary | ICD-10-CM

## 2024-05-25 DIAGNOSIS — K5900 Constipation, unspecified: Secondary | ICD-10-CM | POA: Diagnosis present

## 2024-05-25 DIAGNOSIS — K5010 Crohn's disease of large intestine without complications: Secondary | ICD-10-CM | POA: Diagnosis present

## 2024-05-25 DIAGNOSIS — K50818 Crohn's disease of both small and large intestine with other complication: Principal | ICD-10-CM | POA: Diagnosis present

## 2024-05-25 DIAGNOSIS — M458 Ankylosing spondylitis sacral and sacrococcygeal region: Secondary | ICD-10-CM | POA: Diagnosis present

## 2024-05-25 DIAGNOSIS — K5000 Crohn's disease of small intestine without complications: Secondary | ICD-10-CM

## 2024-05-25 DIAGNOSIS — Z5986 Financial insecurity: Secondary | ICD-10-CM

## 2024-05-25 LAB — COMPREHENSIVE METABOLIC PANEL WITH GFR
ALT: 12 U/L (ref 0–44)
AST: 22 U/L (ref 15–41)
Albumin: 3.7 g/dL (ref 3.5–5.0)
Alkaline Phosphatase: 69 U/L (ref 38–126)
Anion gap: 14 (ref 5–15)
BUN: 9 mg/dL (ref 6–20)
CO2: 23 mmol/L (ref 22–32)
Calcium: 9.3 mg/dL (ref 8.9–10.3)
Chloride: 101 mmol/L (ref 98–111)
Creatinine, Ser: 0.89 mg/dL (ref 0.61–1.24)
GFR, Estimated: >60 mL/min (ref >60)
Glucose, Bld: 67 mg/dL — ABNORMAL LOW (ref 70–99)
Potassium: 4.2 mmol/L (ref 3.5–5.1)
Sodium: 138 mmol/L (ref 135–145)
Total Bilirubin: 0.7 mg/dL (ref 0.0–1.2)
Total Protein: 7.3 g/dL (ref 6.5–8.1)

## 2024-05-25 LAB — URINALYSIS, ROUTINE W REFLEX MICROSCOPIC
Bilirubin Urine: NEGATIVE
Glucose, UA: NEGATIVE mg/dL
Ketones, ur: 20 mg/dL — AB
Leukocytes,Ua: NEGATIVE
Nitrite: NEGATIVE
Protein, ur: NEGATIVE mg/dL
Specific Gravity, Urine: 1.019 (ref 1.005–1.030)
pH: 5 (ref 5.0–8.0)

## 2024-05-25 LAB — CBC
HCT: 44.9 % (ref 39.0–52.0)
Hemoglobin: 14.5 g/dL (ref 13.0–17.0)
MCH: 28.7 pg (ref 26.0–34.0)
MCHC: 32.3 g/dL (ref 30.0–36.0)
MCV: 88.9 fL (ref 80.0–100.0)
Platelets: 253 10*3/uL (ref 150–400)
RBC: 5.05 MIL/uL (ref 4.22–5.81)
RDW: 11.7 % (ref 11.5–15.5)
WBC: 5.5 10*3/uL (ref 4.0–10.5)
nRBC: 0 % (ref 0.0–0.2)

## 2024-05-25 LAB — LIPASE, BLOOD: Lipase: 28 U/L (ref 11–51)

## 2024-05-25 NOTE — ED Triage Notes (Signed)
 Pt arrives POV c/o centralized abd pain radiating into his back. Hx of chrons and this pain has been going on for 3 weeks but worsened today. No nausea or diarrhea but states that he vomits when he tries to eat.

## 2024-05-26 ENCOUNTER — Emergency Department (HOSPITAL_COMMUNITY)

## 2024-05-26 ENCOUNTER — Encounter (HOSPITAL_COMMUNITY): Payer: Self-pay

## 2024-05-26 DIAGNOSIS — K5000 Crohn's disease of small intestine without complications: Secondary | ICD-10-CM | POA: Diagnosis not present

## 2024-05-26 DIAGNOSIS — Z8615 Personal history of latent tuberculosis infection: Secondary | ICD-10-CM | POA: Diagnosis not present

## 2024-05-26 DIAGNOSIS — R109 Unspecified abdominal pain: Secondary | ICD-10-CM | POA: Diagnosis not present

## 2024-05-26 DIAGNOSIS — K50012 Crohn's disease of small intestine with intestinal obstruction: Secondary | ICD-10-CM | POA: Diagnosis not present

## 2024-05-26 DIAGNOSIS — K5010 Crohn's disease of large intestine without complications: Secondary | ICD-10-CM | POA: Diagnosis present

## 2024-05-26 DIAGNOSIS — K5900 Constipation, unspecified: Secondary | ICD-10-CM | POA: Diagnosis present

## 2024-05-26 DIAGNOSIS — Z5971 Insufficient health insurance coverage: Secondary | ICD-10-CM | POA: Diagnosis not present

## 2024-05-26 DIAGNOSIS — K50818 Crohn's disease of both small and large intestine with other complication: Secondary | ICD-10-CM | POA: Diagnosis present

## 2024-05-26 DIAGNOSIS — M456 Ankylosing spondylitis lumbar region: Secondary | ICD-10-CM | POA: Diagnosis present

## 2024-05-26 DIAGNOSIS — K50119 Crohn's disease of large intestine with unspecified complications: Secondary | ICD-10-CM | POA: Diagnosis not present

## 2024-05-26 DIAGNOSIS — Z5986 Financial insecurity: Secondary | ICD-10-CM | POA: Diagnosis not present

## 2024-05-26 DIAGNOSIS — R112 Nausea with vomiting, unspecified: Secondary | ICD-10-CM | POA: Diagnosis present

## 2024-05-26 DIAGNOSIS — E611 Iron deficiency: Secondary | ICD-10-CM | POA: Diagnosis present

## 2024-05-26 DIAGNOSIS — D72819 Decreased white blood cell count, unspecified: Secondary | ICD-10-CM | POA: Diagnosis present

## 2024-05-26 DIAGNOSIS — M458 Ankylosing spondylitis sacral and sacrococcygeal region: Secondary | ICD-10-CM | POA: Diagnosis present

## 2024-05-26 LAB — BASIC METABOLIC PANEL WITH GFR
Anion gap: 8 (ref 5–15)
BUN: 8 mg/dL (ref 6–20)
CO2: 28 mmol/L (ref 22–32)
Calcium: 9.3 mg/dL (ref 8.9–10.3)
Chloride: 100 mmol/L (ref 98–111)
Creatinine, Ser: 0.77 mg/dL (ref 0.61–1.24)
GFR, Estimated: >60 mL/min (ref >60)
Glucose, Bld: 113 mg/dL — ABNORMAL HIGH (ref 70–99)
Potassium: 4.5 mmol/L (ref 3.5–5.1)
Sodium: 136 mmol/L (ref 135–145)

## 2024-05-26 LAB — CBC
HCT: 45.7 % (ref 39.0–52.0)
Hemoglobin: 15 g/dL (ref 13.0–17.0)
MCH: 28.7 pg (ref 26.0–34.0)
MCHC: 32.8 g/dL (ref 30.0–36.0)
MCV: 87.5 fL (ref 80.0–100.0)
Platelets: 241 10*3/uL (ref 150–400)
RBC: 5.22 MIL/uL (ref 4.22–5.81)
RDW: 11.7 % (ref 11.5–15.5)
WBC: 3.3 10*3/uL — ABNORMAL LOW (ref 4.0–10.5)
nRBC: 0 % (ref 0.0–0.2)

## 2024-05-26 LAB — HIV ANTIBODY (ROUTINE TESTING W REFLEX): HIV Screen 4th Generation wRfx: NONREACTIVE

## 2024-05-26 LAB — MAGNESIUM: Magnesium: 2.1 mg/dL (ref 1.7–2.4)

## 2024-05-26 LAB — PHOSPHORUS: Phosphorus: 2.2 mg/dL — ABNORMAL LOW (ref 2.5–4.6)

## 2024-05-26 MED ORDER — MORPHINE SULFATE (PF) 4 MG/ML IV SOLN
4.0000 mg | Freq: Once | INTRAVENOUS | Status: AC
  Administered 2024-05-26: 4 mg via INTRAVENOUS
  Filled 2024-05-26: qty 1

## 2024-05-26 MED ORDER — ONDANSETRON HCL 4 MG/2ML IJ SOLN
4.0000 mg | Freq: Once | INTRAMUSCULAR | Status: AC
  Administered 2024-05-26: 4 mg via INTRAVENOUS
  Filled 2024-05-26: qty 2

## 2024-05-26 MED ORDER — PREDNISONE 20 MG PO TABS
60.0000 mg | ORAL_TABLET | Freq: Once | ORAL | Status: AC
  Administered 2024-05-26: 60 mg via ORAL
  Filled 2024-05-26: qty 3

## 2024-05-26 MED ORDER — MORPHINE SULFATE (PF) 2 MG/ML IV SOLN
2.0000 mg | INTRAVENOUS | Status: DC | PRN
  Administered 2024-05-26: 2 mg via INTRAVENOUS
  Filled 2024-05-26: qty 1

## 2024-05-26 MED ORDER — ACETAMINOPHEN 325 MG PO TABS: 650.0000 mg | ORAL_TABLET | Freq: Four times a day (QID) | ORAL | Status: DC | PRN

## 2024-05-26 MED ORDER — FERROUS SULFATE 325 (65 FE) MG PO TABS
325.0000 mg | ORAL_TABLET | Freq: Every day | ORAL | Status: DC
  Administered 2024-05-26: 325 mg via ORAL
  Filled 2024-05-26 (×3): qty 1

## 2024-05-26 MED ORDER — METHYLPREDNISOLONE SODIUM SUCC 40 MG IJ SOLR
40.0000 mg | Freq: Every day | INTRAMUSCULAR | Status: AC
  Administered 2024-05-26 – 2024-05-27 (×2): 40 mg via INTRAVENOUS
  Filled 2024-05-26 (×2): qty 1

## 2024-05-26 MED ORDER — PROCHLORPERAZINE EDISYLATE 10 MG/2ML IJ SOLN
5.0000 mg | Freq: Four times a day (QID) | INTRAMUSCULAR | Status: DC | PRN
  Administered 2024-05-26 (×2): 5 mg via INTRAVENOUS
  Filled 2024-05-26 (×3): qty 2

## 2024-05-26 MED ORDER — OXYCODONE HCL 5 MG PO TABS
5.0000 mg | ORAL_TABLET | Freq: Four times a day (QID) | ORAL | Status: DC | PRN
  Administered 2024-05-26 – 2024-05-28 (×4): 5 mg via ORAL
  Filled 2024-05-26 (×4): qty 1

## 2024-05-26 MED ORDER — MELATONIN 5 MG PO TABS
5.0000 mg | ORAL_TABLET | Freq: Every evening | ORAL | Status: DC | PRN
  Administered 2024-05-27: 5 mg via ORAL
  Filled 2024-05-26: qty 1

## 2024-05-26 MED ORDER — IOHEXOL 350 MG/ML SOLN
75.0000 mL | Freq: Once | INTRAVENOUS | Status: AC | PRN
  Administered 2024-05-26: 75 mL via INTRAVENOUS

## 2024-05-26 MED ORDER — MORPHINE SULFATE (PF) 4 MG/ML IV SOLN
4.0000 mg | INTRAVENOUS | Status: DC | PRN
  Administered 2024-05-26: 4 mg via INTRAVENOUS
  Filled 2024-05-26 (×2): qty 1

## 2024-05-26 MED ORDER — MORPHINE SULFATE (PF) 2 MG/ML IV SOLN
2.0000 mg | Freq: Once | INTRAVENOUS | Status: AC
  Administered 2024-05-26: 2 mg via INTRAVENOUS
  Filled 2024-05-26: qty 1

## 2024-05-26 MED ORDER — POLYETHYLENE GLYCOL 3350 17 G PO PACK
17.0000 g | PACK | Freq: Every day | ORAL | Status: DC | PRN
  Administered 2024-05-26: 17 g via ORAL
  Filled 2024-05-26: qty 1

## 2024-05-26 NOTE — Consult Note (Addendum)
 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 Admitted with abdominal pain / active Crohn's on CT scan after being off Remicade since January 2025 due to loss of health insurance.  CT scan demonstrating active inflammatory bowel disease involving the distal 25 cm of ileum. There are alternating segments of narrowing and patulous small bowel within the inflamed segment suggesting inflammatory strictures.   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  --Will need Prednisone  taper upon discharge --He is reportedly scheduled for Remicade infusion with [facility] next Tuesday.  --He is up to date on Quantiferon Gold - negative Jan 2025   HPI   43 y.o. year old male with a medical history including but not limited to Crohn's disease  Jeryn has ileal Crohn's disease. He presented to [facility] in 2021 with anemia and weight loss.  In Nov  2021 colonoscopy showed a 4 cm mass in ascending colon extending into ICV and TI but biopsies showed only mild chronic active colitis without dysplasia. CEA was normal. No metastatic disease on imaging. Quantiferon Gold was positive, referred to Pulmonary who diagnosed him with latent TB. He was treated with Rifampin and also Prednisone  and Remicade for his Crohn's.  Repeat colonoscopy in Oct 2022 showed improvement in Crohn's ileitis. Remicade increased to 10 mg / kg Q 8 weeks. He lost insurance and went  without Remicade for about a year. Remicade resumed feb 2024. He continued infusions until late January 2025 when he lost health insurance again.  Remicade seemed to work well for him as it controlled his abdominal pain. He hasn't really ever had problems with diarrhea, just abdominal pain and poor appetite with flares. Following last infusion in late January 2025 he felt well for a few months. Then a couple of months he began getting recurrent mid abdominal pain which has progressed over the last month. The pain is worse with eating. He also frequently has back pain.  He has lost weight but unable to quantify amount. His BMs are sometimes hard in consistency but he has a BM almost daily. No blood in stools.   [place] has renewed Medicaid. He has an appt for a Remicade infusion with [facility] next Tuesday.   Labs equivocal for iron deficiency  CT AP with contrast Active inflammatory bowel disease involving the distal 25 cm of ileum. There are alternating segments of narrowing and patulous small bowel within the inflamed segment suggesting inflammatory strictures. No adjacent abscess or penetrating disease.   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 HLA B27 antigen negative   Labs and Imaging:  Recent Labs    05/25/24 1719  PROT 7.3  ALBUMIN 3.7  AST 22  ALT 12  ALKPHOS 69  BILITOT 0.7   Recent Labs    05/25/24 1719 05/26/24 0617  WBC 5.5 3.3*  HGB 14.5 15.0  HCT 44.9 45.7  MCV 88.9 87.5  PLT 253 241   Recent Labs    05/25/24 1719 05/26/24 0617  NA 138 136  K 4.2 4.5  CL 101 100  CO2 23 28  GLUCOSE 67* 113*  BUN 9 8  CREATININE 0.89 0.77  CALCIUM 9.3 9.3     CT ABDOMEN PELVIS W CONTRAST CLINICAL DATA:  Crohn's exacerbation, back pain and abdominal pain and nausea  EXAM: CT ABDOMEN AND PELVIS WITH CONTRAST  TECHNIQUE: Multidetector CT imaging of the abdomen and pelvis was performed using the standard protocol following bolus administration  of intravenous contrast.  RADIATION DOSE REDUCTION: This exam was performed according to the departmental dose-optimization program which includes automated exposure control, adjustment of the mA and/or kV according to patient size and/or use of iterative reconstruction technique.  CONTRAST:  75mL OMNIPAQUE IOHEXOL 350 MG/ML SOLN  COMPARISON:  None Available.  FINDINGS: Lower chest: No acute abnormality.  Hepatobiliary: Unremarkable liver. Normal gallbladder. No biliary dilation.  Pancreas: Unremarkable.  Spleen: Unremarkable.  Adrenals/Urinary Tract: Normal adrenal glands. No urinary calculi or hydronephrosis. Bladder is unremarkable.  Stomach/Bowel: Normal caliber large and small bowel. The appendix is normal.Stomach is within normal limits.  Wall thickening and mucosal hyperenhancement of the distal approximately 25 cm of ileum. There are alternating segments of narrowing and patulous small bowel within the inflamed segment suggesting inflammatory strictures. No adjacent abscess or penetrating disease.  Vascular/Lymphatic: No significant vascular findings are present. No enlarged abdominal or pelvic lymph nodes.  Reproductive: Unremarkable.  Other: No free intraperitoneal fluid or air.  Musculoskeletal: No acute fracture. Demineralization. 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  IMPRESSION: Active inflammatory bowel disease involving the distal 25 cm of ileum. There are alternating segments of narrowing and patulous small bowel within the inflamed segment suggesting inflammatory strictures. No adjacent abscess or penetrating disease.  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  Electronically Signed   By: Rozell Cornet M.D.   On: 05/26/2024 03:49    Pertinent GI Studies   Oct 2022 colonoscopy ( [facility]) Unable to see results but per notes there was improvement in ileitis   Nov 2021 EGD and colonoscopy  (  [facility]) 1- EGD - mild gastritis 2- Colonoscopy- Colon mass noted in the proximal ascending colon growing into the ileocecal valve involving the terminal ileum. Terminal ileum was noted to be ulcerated with nodular inflammation/mass. Partial obstruction noted and I was unable traverse past the distal terminal ileum. Likely this is malignant mass however less likely this could be inflammatory mass created from underlying Crohn's.   Biopsies : unable to see in Care Everywhere but based on notes they showed mild chronic active colitis without dysplasia   Past Medical History:  Diagnosis Date   Crohn disease (HCC)     History reviewed. No pertinent surgical history.  History reviewed. No pertinent family history.  Prior to Admission medications   Medication Sig Start Date End Date Taking? Authorizing Provider  albuterol  (PROVENTIL  HFA;VENTOLIN  HFA) 108 (90 Base) MCG/ACT inhaler Inhale 2 puffs into the lungs every 6 (six) hours as needed for wheezing or shortness of breath. 02/07/19   Buena Carmine, NP  cetirizine  (ZYRTEC ) 10 MG tablet Take 1 tablet (10 mg total) by mouth daily.  12/02/18   Buena Carmine, NP  ferrous sulfate  (FERROUSUL) 325 (65 FE) MG tablet Take 1 tablet (325 mg total) by mouth 2 (two) times daily with a meal. 10/21/18   Buena Carmine, NP  lidocaine  (XYLOCAINE ) 2 % solution Use as directed 10 mLs in the mouth or throat every 4 (four) hours as needed for mouth pain. 08/27/22   Carlee Charters, NP  omeprazole  (PRILOSEC) 40 MG capsule Take 1 capsule (40 mg total) by mouth daily. 10/14/18   Buena Carmine, NP  oxyCODONE -acetaminophen  (PERCOCET/ROXICET) 5-325 MG tablet Take 1 tablet by mouth every 8 (eight) hours as needed for severe pain (pain score 7-10). 01/10/24   Carleton Cheek, PA-C  predniSONE  (DELTASONE ) 10 MG tablet Take 4 tablets by mouth once daily for 5 days then 3 tablet daily for the next 5 days then 2 tablets daily for the next 5 days and then 1 tablet daily  until finished. 08/27/22   Carlee Charters, NP  Vitamin D , Ergocalciferol , (DRISDOL ) 1.25 MG (50000 UT) CAPS capsule Take 1 capsule (50,000 Units total) by mouth every 7 (seven) days. 07/14/19   Buena Carmine, NP    Current Facility-Administered Medications  Medication Dose Route Frequency Provider Last Rate Last Admin   acetaminophen  (TYLENOL ) tablet 650 mg  650 mg Oral Q6H PRN Bary Boss, DO       ferrous sulfate  tablet 325 mg  325 mg Oral Q breakfast Bary Boss, DO   325 mg at 05/26/24 4098   melatonin tablet 5 mg  5 mg Oral QHS PRN Bary Boss, DO       methylPREDNISolone  sodium succinate (SOLU-MEDROL ) 40 mg/mL injection 40 mg  40 mg Intravenous Daily Reesa Cannon N, DO   40 mg at 05/26/24 1191   morphine  (PF) 2 MG/ML injection 2 mg  2 mg Intravenous Q3H PRN Reesa Cannon N, DO       oxyCODONE  (Oxy IR/ROXICODONE ) immediate release tablet 5 mg  5 mg Oral Q6H PRN Reesa Cannon N, DO       polyethylene glycol (MIRALAX  / GLYCOLAX ) packet 17 g  17 g Oral Daily PRN Reesa Cannon N, DO       prochlorperazine  (COMPAZINE ) injection 5 mg  5 mg Intravenous Q6H PRN Reesa Cannon N, DO        Allergies as of 05/25/2024 - Review Complete 05/25/2024  Allergen Reaction Noted   Ibuprofen Other (See Comments) 09/25/2020    Social History   Socioeconomic History   Marital status: Married    Spouse name: Not on file   Number of children: 12   Years of education: Not on file   Highest education level: Not on file  Occupational History   Not on file  Tobacco Use   Smoking status: Never   Smokeless tobacco: Never  Substance and Sexual Activity   Alcohol use: Never   Drug use: Never   Sexual activity: Yes    Partners: Female  Other Topics Concern   Not on file  Social History Narrative   Not on file   Social Drivers of Health   Financial Resource Strain: Medium Risk (08/08/2021)   Received from [facility], [facility]   Overall Financial Resource Strain (CARDIA)     Difficulty of Paying Living Expenses: Somewhat hard  Food Insecurity: No Food Insecurity (02/09/2022)   Received from [facility], [facility]   Hunger Vital Sign    Worried About Running Out of Food in  the Last Year: Never true    Ran Out of Food in the Last Year: Never true  Transportation Needs: No Transportation Needs (08/08/2021)   Received from [facility], [facility]   [facility] - Transportation    Lack of Transportation (Medical): No    Lack of Transportation (Non-Medical): No  Physical Activity: Insufficiently Active (08/08/2021)   Received from [facility], [facility]   Exercise Vital Sign    Days of Exercise per Week: 2 days    Minutes of Exercise per Session: 10 min  Stress: No Stress Concern Present (08/08/2021)   Received from [facility], [facility] - Occupational Stress Questionnaire    Feeling of Stress : Not at all  Social Connections: Unknown (04/27/2022)   [facility] from [facility]   Social Network    Social Network: Not on file  Intimate Partner Violence: Unknown (04/03/2022)   Received from [facility]   HITS    Physically Hurt: Not on file    Insult or Talk Down To: Not on file    Threaten Physical Harm: Not on file    Scream or Curse: Not on file     Code Status   Code Status: Full Code  Review of Systems: All systems reviewed and negative except where noted in HPI.  Physical Exam: Vital signs in last 24 hours: Temp:  [97.5 F (36.4 C)-98.4 F (36.9 C)] 97.5 F (36.4 C) (05/30 0540) Pulse Rate:  [54-87] 54 (05/30 0540) Resp:  [15-18] 18 (05/30 0540) BP: (112-125)/(73-82) 125/75 (05/30 0540) SpO2:  [97 %-100 %] 100 % (05/30 0540) Weight:  [49.9 kg] 49.9 kg (05/29 1718) Last BM Date : 05/25/24  General:  Pleasant thin male in NAD Psych:  Cooperative. Normal mood and affect Eyes: Pupils equal Ears:  Normal auditory acuity Nose: No deformity, discharge or lesions Neck:  Supple, no  masses felt Lungs:  Clear to auscultation.  Heart:  Regular rate, regular rhythm.  Abdomen:  Soft, nondistended, mild periumbilical tendernss, Active bowel sounds, no masses felt Rectal :  Deferred Msk: Symmetrical without gross deformities.  Neurologic:  Alert, oriented, grossly normal neurologically Extremities : No edema Skin:  Intact without significant lesions.    Intake/Output from previous day: No intake/output data recorded. Intake/Output this shift:  No intake/output data recorded.   Mai Schwalbe, NP-C   05/26/2024, 9:07 AM    Attending physician's note   I have taken history, reviewed the chart and examined the patient. I performed a substantive portion of this encounter, including complete performance of at least one of the key components, in conjunction with the APP. I agree with the Advanced Practitioner's note, impression and recommendations.   Pt of Dr Alverta Avers ([facility])  Crohns ileitis with exacerbation. No abscess or fistulous.  On CT, distal 25 cm of TI stricture appears to have inflammatory and fibrotic component. Dx 2021.  Has responded well to Remicade.  Unfortunately lost insurance, and stopped.  Now has Medicaid.  Next Remicade infusion 10 mg/kg scheduled 6/3  Latent TB s/p rifampin x 4 months. 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 (On D/C on pred 40 every day x 2 weeks, then 30 x 2 weeks, then 20 x 2 weeks, 10 every day x 2 weeks) -Must get Remicade 10 mg/kg as scheduled with [facility] 6/3.  Can consider  checking Remicade level/antibodies prior to next infusion. -Avoid NSAIDs. -He will discuss with Dr Hervey Loser regarding Sx eval. -He is under excellent GI care. Able to tolerate p.o. well. No obstruction. Expect D/C later tomorrow or day after. -GI will sign off. Pl call with any ? -D/W pt in detail.   Magnus Schuller, MD [employer]  GI [phone]

## 2024-05-26 NOTE — Plan of Care (Signed)
   Problem: Education: Goal: Knowledge of General Education information will improve Description Including pain rating scale, medication(s)/side effects and non-pharmacologic comfort measures Outcome: Progressing

## 2024-05-26 NOTE — H&P (Signed)
 History and Physical    Calvin Pittman BJY:[id number] DOB: November 24, 1981 DOA: 05/25/2024  PCP: Lemon Qua., MD   Patient coming from: Home  I have personally briefly reviewed patient's old medical records in [facility]  Chief Complaint: Abdominal pain, nausea and vomiting  HPI: Calvin Pittman is a 43 y.o. male with medical history significant of Crohn's disease currently not on IV infusions since January due to insurance issues presented with worsening abdominal pain, nausea and vomiting ongoing for the last 3 weeks.  His symptoms have worsened in the last 2 days.  Abdominal pain is mostly located on the right side of the abdomen with radiation to all through his abdomen.  Complains of nausea and vomiting along with constipation.  Denies any bloody stool.  No fever, cough, shortness of breath, chest pain, dysuria, loss of consciousness or seizures.  Pain is sharp in nature and up to 9 out of 10 in intensity.  ED Course: Vitals were stable.  Labs revealed no leukocytosis.  CT of the abdomen and pelvis with contrast showed findings suggestive of possible Crohn's disease flare without abscess.  He was started on steroids.  GI has been consulted. Hospitalist service was called to evaluate the patient.  Review of Systems: As per HPI otherwise all other systems were reviewed and are negative.   Past Medical History:  Diagnosis Date   Crohn disease (HCC)     History reviewed. No pertinent surgical history.   reports that he has never smoked. He has never used smokeless tobacco. He reports that he does not drink alcohol and does not use drugs.  Allergies  Allergen Reactions   Ibuprofen Other (See Comments)    History reviewed. No pertinent family history.  Prior to Admission medications   Medication Sig Start Date End Date Taking? Authorizing Provider  albuterol  (PROVENTIL  HFA;VENTOLIN  HFA) 108 (90 Base) MCG/ACT inhaler Inhale 2 puffs into the lungs every 6 (six) hours  as needed for wheezing or shortness of breath. 02/07/19   Buena Carmine, NP  cetirizine  (ZYRTEC ) 10 MG tablet Take 1 tablet (10 mg total) by mouth daily. 12/02/18   Buena Carmine, NP  ferrous sulfate  (FERROUSUL) 325 (65 FE) MG tablet Take 1 tablet (325 mg total) by mouth 2 (two) times daily with a meal. 10/21/18   Buena Carmine, NP  lidocaine  (XYLOCAINE ) 2 % solution Use as directed 10 mLs in the mouth or throat every 4 (four) hours as needed for mouth pain. 08/27/22   Carlee Charters, NP  omeprazole  (PRILOSEC) 40 MG capsule Take 1 capsule (40 mg total) by mouth daily. 10/14/18   Buena Carmine, NP  oxyCODONE -acetaminophen  (PERCOCET/ROXICET) 5-325 MG tablet Take 1 tablet by mouth every 8 (eight) hours as needed for severe pain (pain score 7-10). 01/10/24   Carleton Cheek, PA-C  predniSONE  (DELTASONE ) 10 MG tablet Take 4 tablets by mouth once daily for 5 days then 3 tablet daily for the next 5 days then 2 tablets daily for the next 5 days and then 1 tablet daily until finished. 08/27/22   Carlee Charters, NP  Vitamin D , Ergocalciferol , (DRISDOL ) 1.25 MG (50000 UT) CAPS capsule Take 1 capsule (50,000 Units total) by mouth every 7 (seven) days. 07/14/19   Buena Carmine, NP    Physical Exam: Vitals:   05/26/24 0300 05/26/24 0500 05/26/24 0509 05/26/24 0540  BP: 125/80 114/80  125/75  Pulse: 62 65  (!) 54  Resp: 15 16  18   Temp:  98.1 F (36.7 C)  98 F (36.7 C) (!) 97.5 F (36.4 C)  TempSrc: Oral     SpO2: 97% 98%  100%  Weight:      Height:        Constitutional: NAD, calm, comfortable Vitals:   05/26/24 0300 05/26/24 0500 05/26/24 0509 05/26/24 0540  BP: 125/80 114/80  125/75  Pulse: 62 65  (!) 54  Resp: 15 16  18   Temp: 98.1 F (36.7 C)  98 F (36.7 C) (!) 97.5 F (36.4 C)  TempSrc: Oral     SpO2: 97% 98%  100%  Weight:      Height:       Eyes: PERRL, lids and conjunctivae normal ENMT: Mucous membranes are dry. Posterior pharynx clear of any exudate or  lesions. Neck: normal, supple, no masses, no thyromegaly Respiratory: bilateral decreased breath sounds at bases, no wheezing, no crackles. Normal respiratory effort. No accessory muscle use.  Cardiovascular: S1 S2 positive, rate controlled. No extremity edema. 2+ pedal pulses.  Abdomen: Tenderness mostly in the lower quadrant with no rebound tenderness; no masses palpated. No hepatosplenomegaly. Bowel sounds positive.  Musculoskeletal: no clubbing / cyanosis. No joint deformity upper and lower extremities.  Skin: no rashes, lesions, ulcers. No induration Neurologic: CN 2-12 grossly intact. Moving extremities. No focal neurologic deficits.  Psychiatric: Normal judgment and insight. Alert and oriented x 3. Normal mood.    Labs on Admission: I have personally reviewed following labs and imaging studies  CBC: Recent Labs  Lab 05/25/24 1719 05/26/24 0617  WBC 5.5 3.3*  HGB 14.5 15.0  HCT 44.9 45.7  MCV 88.9 87.5  PLT 253 241   Basic Metabolic Panel: Recent Labs  Lab 05/25/24 1719 05/26/24 0617  NA 138 136  K 4.2 4.5  CL 101 100  CO2 23 28  GLUCOSE 67* 113*  BUN 9 8  CREATININE 0.89 0.77  CALCIUM 9.3 9.3  MG  --  2.1  PHOS  --  2.2*   GFR: Estimated Creatinine Clearance: 85.8 mL/min (by C-G formula based on SCr of 0.77 mg/dL). Liver Function Tests: Recent Labs  Lab 05/25/24 1719  AST 22  ALT 12  ALKPHOS 69  BILITOT 0.7  PROT 7.3  ALBUMIN 3.7   Recent Labs  Lab 05/25/24 1719  LIPASE 28   No results for input(s): "AMMONIA" in the last 168 hours. Coagulation Profile: No results for input(s): "INR", "PROTIME" in the last 168 hours. Cardiac Enzymes: No results for input(s): "CKTOTAL", "CKMB", "CKMBINDEX", "TROPONINI" in the last 168 hours. BNP (last 3 results) No results for input(s): "PROBNP" in the last 8760 hours. HbA1C: No results for input(s): "HGBA1C" in the last 72 hours. CBG: No results for input(s): "GLUCAP" in the last 168 hours. Lipid Profile: No  results for input(s): "CHOL", "HDL", "LDLCALC", "TRIG", "CHOLHDL", "LDLDIRECT" in the last 72 hours. Thyroid  Function Tests: No results for input(s): "TSH", "T4TOTAL", "FREET4", "T3FREE", "THYROIDAB" in the last 72 hours. Anemia Panel: No results for input(s): "VITAMINB12", "FOLATE", "FERRITIN", "TIBC", "IRON", "RETICCTPCT" in the last 72 hours. Urine analysis:    Component Value Date/Time   COLORURINE YELLOW 05/25/2024 1719   APPEARANCEUR CLEAR 05/25/2024 1719   LABSPEC 1.019 05/25/2024 1719   PHURINE 5.0 05/25/2024 1719   GLUCOSEU NEGATIVE 05/25/2024 1719   HGBUR SMALL (A) 05/25/2024 1719   BILIRUBINUR NEGATIVE 05/25/2024 1719   KETONESUR 20 (A) 05/25/2024 1719   PROTEINUR NEGATIVE 05/25/2024 1719   NITRITE NEGATIVE 05/25/2024 1719   LEUKOCYTESUR NEGATIVE 05/25/2024  1719    Radiological Exams on Admission: CT ABDOMEN PELVIS W CONTRAST Result Date: 05/26/2024 CLINICAL DATA:  Crohn's exacerbation, back pain and abdominal pain and nausea EXAM: CT ABDOMEN AND PELVIS WITH CONTRAST TECHNIQUE: Multidetector CT imaging of the abdomen and pelvis was performed using the standard protocol following bolus administration of intravenous contrast. RADIATION DOSE REDUCTION: This exam was performed according to the departmental dose-optimization program which includes automated exposure control, adjustment of the mA and/or kV according to patient size and/or use of iterative reconstruction technique. CONTRAST:  75mL OMNIPAQUE IOHEXOL 350 MG/ML SOLN COMPARISON:  None Available. FINDINGS: Lower chest: No acute abnormality. Hepatobiliary: Unremarkable liver. Normal gallbladder. No biliary dilation. Pancreas: Unremarkable. Spleen: Unremarkable. Adrenals/Urinary Tract: Normal adrenal glands. No urinary calculi or hydronephrosis. Bladder is unremarkable. Stomach/Bowel: Normal caliber large and small bowel. The appendix is normal.Stomach is within normal limits. Wall thickening and mucosal hyperenhancement of the  distal approximately 25 cm of ileum. There are alternating segments of narrowing and patulous small bowel within the inflamed segment suggesting inflammatory strictures. No adjacent abscess or penetrating disease. Vascular/Lymphatic: No significant vascular findings are present. No enlarged abdominal or pelvic lymph nodes. Reproductive: Unremarkable. Other: No free intraperitoneal fluid or air. Musculoskeletal: No acute fracture. Demineralization. 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 IMPRESSION: Active inflammatory bowel disease involving the distal 25 cm of ileum. There are alternating segments of narrowing and patulous small bowel within the inflamed segment suggesting inflammatory strictures. No adjacent abscess or penetrating disease. 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 Electronically Signed   By: Rozell Cornet M.D.   On: 05/26/2024 03:49    Assessment/Plan  Possible Crohn's disease flare in a patient with history of Crohn's disease currently not on treatment because of lack of insurance - Presented with worsening abdominal pain, nausea and vomiting for the last 3 weeks.  Afebrile with no leukocytosis.  Imaging as above. - Continue Solu-Medrol  for now.  Await GI evaluation.  Leukopenia - Mild.  Monitor intermittently  DVT prophylaxis: Early ambulation Code Status: Full Family Communication: None at bedside Disposition Plan: Home in 2 to 3 days most likely improved Consults called: GI called by ED provider Admission status: Inpatient Severity of Illness: The appropriate patient status for this patient is INPATIENT. Inpatient status is judged to be reasonable and necessary in order to provide the required intensity of service to ensure the patient's safety. The patient's presenting symptoms, physical exam findings, and initial radiographic and laboratory data in the context of their chronic comorbidities is felt to place them at high risk  for further clinical deterioration. Furthermore, it is not anticipated that the patient will be medically stable for discharge from the hospital within 2 midnights of admission.   * I certify that at the point of admission it is my clinical judgment that the patient will require inpatient hospital care spanning beyond 2 midnights from the point of admission due to high intensity of service, high risk for further deterioration and high frequency of surveillance required.Audria Leather MD [facility]  05/26/2024, 8:46 AM

## 2024-05-26 NOTE — ED Provider Notes (Signed)
 [place] [facility]GENC[facility] DEPARTMENT AT [facility] Provider Note   CSN: [id number] Arrival date & time: 05/25/24  1657     History  Chief Complaint  Patient presents with   Abdominal Pain   Back Pain   Nausea    Calvin Pittman is a 43 y.o. male with medical history of Crohn's disease.  Patient presents to the ED for evaluation of abdominal pain, nausea, vomiting.  Reports symptoms been ongoing for the last 3 weeks.  Reports in the last 2 days of symptoms have acutely worsened.  He states his abdominal pain is primarily located on the right side of his abdomen but states he also has discomfort all through his abdomen.  He endorses nausea and vomiting.  Reports he last threw up yesterday.  He states he is having a hard time having a bowel movement.  Reports he last had a bowel movement today but was having to strain, the bowel movement was "very hard".  He denies any blood in his stool.  He denies fevers at home.  He denies chest pain or shortness of breath.  He reports that he has not had IV infusion for his Crohn's disease since January due to insurance issues.  Reports abdominal pain is currently 9 out of 10.  Denies history of bowel obstructions.   Abdominal Pain Associated symptoms: constipation, nausea and vomiting   Associated symptoms: no diarrhea, no dysuria and no fever   Back Pain Associated symptoms: abdominal pain   Associated symptoms: no dysuria and no fever        Home Medications Prior to Admission medications   Medication Sig Start Date End Date Taking? Authorizing Provider  albuterol  (PROVENTIL  HFA;VENTOLIN  HFA) 108 (90 Base) MCG/ACT inhaler Inhale 2 puffs into the lungs every 6 (six) hours as needed for wheezing or shortness of breath. 02/07/19   Buena Carmine, NP  cetirizine  (ZYRTEC ) 10 MG tablet Take 1 tablet (10 mg total) by mouth daily. 12/02/18   Buena Carmine, NP  ferrous sulfate  (FERROUSUL) 325 (65 FE) MG tablet Take 1 tablet (325 mg  total) by mouth 2 (two) times daily with a meal. 10/21/18   Buena Carmine, NP  lidocaine  (XYLOCAINE ) 2 % solution Use as directed 10 mLs in the mouth or throat every 4 (four) hours as needed for mouth pain. 08/27/22   Carlee Charters, NP  omeprazole  (PRILOSEC) 40 MG capsule Take 1 capsule (40 mg total) by mouth daily. 10/14/18   Buena Carmine, NP  oxyCODONE -acetaminophen  (PERCOCET/ROXICET) 5-325 MG tablet Take 1 tablet by mouth every 8 (eight) hours as needed for severe pain (pain score 7-10). 01/10/24   Carleton Cheek, PA-C  predniSONE  (DELTASONE ) 10 MG tablet Take 4 tablets by mouth once daily for 5 days then 3 tablet daily for the next 5 days then 2 tablets daily for the next 5 days and then 1 tablet daily until finished. 08/27/22   Carlee Charters, NP  Vitamin D , Ergocalciferol , (DRISDOL ) 1.25 MG (50000 UT) CAPS capsule Take 1 capsule (50,000 Units total) by mouth every 7 (seven) days. 07/14/19   Buena Carmine, NP      Allergies    Ibuprofen    Review of Systems   Review of Systems  Constitutional:  Negative for fever.  Gastrointestinal:  Positive for abdominal pain, constipation, nausea and vomiting. Negative for blood in stool and diarrhea.  Genitourinary:  Negative for dysuria.  Musculoskeletal:  Positive for back pain.  All other systems  reviewed and are negative.   Physical Exam Updated Vital Signs BP 114/80   Pulse 65   Temp 98 F (36.7 C)   Resp 16   Ht 6' (1.829 m)   Wt 49.9 kg   SpO2 98%   BMI 14.92 kg/m  Physical Exam Vitals and nursing note reviewed.  Constitutional:      General: He is not in acute distress.    Appearance: He is well-developed.  HENT:     Head: Normocephalic and atraumatic.  Eyes:     Conjunctiva/sclera: Conjunctivae normal.  Cardiovascular:     Rate and Rhythm: Normal rate and regular rhythm.     Heart sounds: No murmur heard. Pulmonary:     Effort: Pulmonary effort is normal. No respiratory distress.     Breath sounds: Normal  breath sounds.  Abdominal:     Palpations: Abdomen is soft.     Tenderness: There is abdominal tenderness.     Comments: Diffuse TTP of abdomen without rebound or guarding.  No CVA tenderness.  No overlying skin change.  Musculoskeletal:        General: No swelling.     Cervical back: Neck supple.  Skin:    General: Skin is warm and dry.     Capillary Refill: Capillary refill takes less than 2 seconds.  Neurological:     Mental Status: He is alert.  Psychiatric:        Mood and Affect: Mood normal.     ED Results / Procedures / Treatments   Labs (all labs ordered are listed, but only abnormal results are displayed) Labs Reviewed  COMPREHENSIVE METABOLIC PANEL WITH GFR - Abnormal; Notable for the following components:      Result Value   Glucose, Bld 67 (*)    All other components within normal limits  URINALYSIS, ROUTINE W REFLEX MICROSCOPIC - Abnormal; Notable for the following components:   Hgb urine dipstick SMALL (*)    Ketones, ur 20 (*)    Bacteria, UA RARE (*)    All other components within normal limits  LIPASE, BLOOD  CBC  HIV ANTIBODY (ROUTINE TESTING W REFLEX)  CBC  BASIC METABOLIC PANEL WITH GFR  MAGNESIUM  PHOSPHORUS    EKG None  Radiology CT ABDOMEN PELVIS W CONTRAST Result Date: 05/26/2024 CLINICAL DATA:  Crohn's exacerbation, back pain and abdominal pain and nausea EXAM: CT ABDOMEN AND PELVIS WITH CONTRAST TECHNIQUE: Multidetector CT imaging of the abdomen and pelvis was performed using the standard protocol following bolus administration of intravenous contrast. RADIATION DOSE REDUCTION: This exam was performed according to the departmental dose-optimization program which includes automated exposure control, adjustment of the mA and/or kV according to patient size and/or use of iterative reconstruction technique. CONTRAST:  75mL OMNIPAQUE IOHEXOL 350 MG/ML SOLN COMPARISON:  None Available. FINDINGS: Lower chest: No acute abnormality. Hepatobiliary:  Unremarkable liver. Normal gallbladder. No biliary dilation. Pancreas: Unremarkable. Spleen: Unremarkable. Adrenals/Urinary Tract: Normal adrenal glands. No urinary calculi or hydronephrosis. Bladder is unremarkable. Stomach/Bowel: Normal caliber large and small bowel. The appendix is normal.Stomach is within normal limits. Wall thickening and mucosal hyperenhancement of the distal approximately 25 cm of ileum. There are alternating segments of narrowing and patulous small bowel within the inflamed segment suggesting inflammatory strictures. No adjacent abscess or penetrating disease. Vascular/Lymphatic: No significant vascular findings are present. No enlarged abdominal or pelvic lymph nodes. Reproductive: Unremarkable. Other: No free intraperitoneal fluid or air. Musculoskeletal: No acute fracture. Demineralization. Ankylosis of the SI joints. Ankylosis of the  lumbar spinous processes. IMPRESSION: Active inflammatory bowel disease involving the distal 25 cm of ileum. There are alternating segments of narrowing and patulous small bowel within the inflamed segment suggesting inflammatory strictures. No adjacent abscess or penetrating disease. 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 Electronically Signed   By: Rozell Cornet M.D.   On: 05/26/2024 03:49    Procedures Procedures    Medications Ordered in ED Medications  methylPREDNISolone  sodium succinate (SOLU-MEDROL ) 40 mg/mL injection 40 mg (has no administration in time range)  ferrous sulfate  tablet 325 mg (has no administration in time range)  morphine  (PF) 2 MG/ML injection 2 mg (has no administration in time range)  oxyCODONE  (Oxy IR/ROXICODONE ) immediate release tablet 5 mg (has no administration in time range)  acetaminophen  (TYLENOL ) tablet 650 mg (has no administration in time range)  prochlorperazine  (COMPAZINE ) injection 5 mg (has no administration in time range)  melatonin tablet 5 mg (has no administration in time range)   polyethylene glycol (MIRALAX  / GLYCOLAX ) packet 17 g (has no administration in time range)  predniSONE  (DELTASONE ) tablet 60 mg (60 mg Oral Given 05/26/24 0252)  ondansetron  (ZOFRAN ) injection 4 mg (4 mg Intravenous Given 05/26/24 0323)  morphine  (PF) 4 MG/ML injection 4 mg (4 mg Intravenous Given 05/26/24 0323)  iohexol  (OMNIPAQUE ) 350 MG/ML injection 75 mL (75 mLs Intravenous Contrast Given 05/26/24 0335)    ED Course/ Medical Decision Making/ A&P Clinical Course as of 05/26/24 0532  Fri May 26, 2024  0207 The patient is a pleasant 43 year old gentleman that has a history of Crohn's disease of the ileum. Patient initially presented with anemia and weight loss in October 2021. EGD and colonoscopy at that time revealed gastritis and ulcerations in the TI with a 4 cm mass in the ascending colon extending into the ileocecal valve and including the terminal ileum. Biopsies came back as mild chronic active colitis without dysplasia. CT scan showed no metastatic disease and his CEA was normal. Findings were thought to be related to Crohn's disease of the small bowel. 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 He was given 4 months of rifampin. He was also started on prednisone  and Remicade for his Crohn's. Repeat colonoscopy October 2022 revealed nodularity at the IC valve and mild erythema of the terminal ileum and findings that had improved. Remicade was increased to 10 mg/kg every 8 weeks. Patient did unfortunately have a lapse in his Remicade treatment due to insurance. Last visit patient was seen by Dr. Alverta Avers on January 17 and was having 2 bowel movements a day. Because of neck stiffness a cervical neck x-ray was ordered the patient was told to take some ibuprofen. He was asked to see Dr. Tommy Frames for the neck pain.  Currently the patient reports his main complaint is some neck pain which is mild and also some discomfort in his back. He says  this is really pain just an uncomfortable sensation. He also has had some change in his gait. He has not seen orthopedics. He says this pain/discomfort does not alter his quality of life. He denies any rectal bleeding, melena, hematemesis. He has 2 bowel movements a day. He has no diarrhea. He denies any abdominal pain. He is having Remicade infusion this afternoon. He has gained some weight and feels better than he has in a long time.   [CG]  0207 02/25/23 office visit note [CG]    Clinical Course User  Index [CG] Adel Aden, PA-C   Medical Decision Making Amount and/or Complexity of Data Reviewed Labs: ordered. Radiology: ordered.  Risk Prescription drug management. Decision regarding hospitalization.   43 year old male presents for evaluation.  Please see HPI for further details.  On examination the patient is afebrile and nontachycardic.  Lung sounds are clear bilaterally, he is not hypoxic.  Abdomen has diffuse tenderness throughout without rebound or guarding, no overlying skin change, no CVA tenderness.  Neurological examination at baseline.  Overall patient does appear uncomfortable.  Lab work initiated in triage include CBC, CMP, lipase and urinalysis.  I have added on CT abdomen pelvis with contrast.  Patient was given morphine for pain control as well as Zofran for nausea.  Also given steroids for suspected Crohn's exacerbation.  CBC without leukocytosis or anemia.  Metabolic panel without electrolyte derangement, no evident LFTs, anion gap 14.  Urinalysis shows small hemoglobin and ketones.  Lipase 28.  CT abdomen pelvis shows active inflammatory bowel disease involving distal 25 cm of ileum.  There are alternating segments of narrowing and patulous small bowel within the inflamed segment suggesting inflammatory strictures.  No adjacent abscess or penetrating disease.  I agree with radiology interpretation.  After patient received morphine, states that his abdominal pain  is related to 7 out of 10.  I do feel this patient requires admission to the hospital for further management care of his Crohn's.  The patient reports he has not been seen for his Crohn's since January due to losing insurance.  At this time, discussed patient with Dr. Del Favia the [facility] hospitalist service will admit patient to the hospital.  Have sent message through secure chat to oncoming GI team requesting consult.  The patient is amenable to this plan.  The patient is stable on admission.   Final Clinical Impression(s) / ED Diagnoses Final diagnoses:  Crohn's disease of colon with complication (HCC)  Exacerbation of Crohn's disease with complication [facility])    Rx / DC Orders ED Discharge Orders     None         Elasia Furnish F, PA-C 05/26/24 Adam Holm, MD 05/26/24 [phone]

## 2024-05-27 DIAGNOSIS — K5900 Constipation, unspecified: Secondary | ICD-10-CM

## 2024-05-27 DIAGNOSIS — K50119 Crohn's disease of large intestine with unspecified complications: Secondary | ICD-10-CM | POA: Diagnosis not present

## 2024-05-27 DIAGNOSIS — R109 Unspecified abdominal pain: Secondary | ICD-10-CM | POA: Diagnosis not present

## 2024-05-27 DIAGNOSIS — R1031 Right lower quadrant pain: Secondary | ICD-10-CM

## 2024-05-27 DIAGNOSIS — K5000 Crohn's disease of small intestine without complications: Secondary | ICD-10-CM

## 2024-05-27 DIAGNOSIS — K50012 Crohn's disease of small intestine with intestinal obstruction: Secondary | ICD-10-CM | POA: Diagnosis not present

## 2024-05-27 MED ORDER — POLYETHYLENE GLYCOL 3350 17 G PO PACK
17.0000 g | PACK | Freq: Once | ORAL | Status: AC
  Administered 2024-05-27: 17 g via ORAL
  Filled 2024-05-27: qty 1

## 2024-05-27 MED ORDER — METHYLPREDNISOLONE SODIUM SUCC 40 MG IJ SOLR
40.0000 mg | INTRAMUSCULAR | Status: DC
  Administered 2024-05-28: 40 mg via INTRAVENOUS
  Filled 2024-05-27: qty 1

## 2024-05-27 MED ORDER — POLYETHYLENE GLYCOL 3350 17 G PO PACK: 17.0000 g | PACK | Freq: Once | ORAL | Status: DC

## 2024-05-27 NOTE — Progress Notes (Signed)
 PROGRESS NOTE    Calvin Pittman  WGN:[id number] DOB: 16-Apr-1981 DOA: 05/25/2024 PCP: Lemon Qua., MD   Brief Narrative:  43 y.o. male with medical history significant of Crohn's disease currently not on IV infusion since January due to insurance issues presented with worsening abdominal pain, nausea and vomiting ongoing for the last 3 weeks.  On presentation, CT of the abdomen and pelvis with contrast showed findings suggestive of possible Crohn's disease flare without abscess. He was started on steroids. GI was consulted.   Assessment & Plan:   Possible Crohn's disease flare in a patient with history of Crohn's disease currently not on treatment because of lack of insurance - Presented with worsening abdominal pain, nausea and vomiting for the last 3 weeks.  Afebrile with no leukocytosis.  Imaging as above. - GI evaluation appreciated: Recommended to continue IV Solu-Medrol  inpatient and discharged home on tapering steroids once medically ready.  Patient is scheduled to get Remicade on 05/30/2024 at [facility].  GI recommends to continue outpatient follow-up with patient's primary gastroenterologist as an outpatient.  GI signed off on 05/26/2024.   - Patient was in significant pain yesterday requiring increased doses of IV morphine .  Does not feel ready to go home today.  Will keep the patient inpatient today on IV Solu-Medrol .  Leukopenia - Mild.  Monitor intermittently.  No labs today.   DVT prophylaxis: Early ambulation Code Status: Full Family Communication: Wife at bedside Disposition Plan: Status is: Inpatient Remains inpatient appropriate because: Of severity of illness.  Possible discharge in 1 to 2 days if clinically improves    Consultants: GI  Procedures: None  Antimicrobials: None   Subjective: Patient seen and examined at bedside.  Feels slightly better but does not feel ready to go home today.  Had severe pain yesterday requiring IV pain meds.  No fever,  chest pain, shortness of breath reported.  Objective: Vitals:   05/26/24 1428 05/26/24 2139 05/27/24 0357 05/27/24 0846  BP: 124/79 115/80 99/70 119/79  Pulse: 74 (!) 56 (!) 55 64  Resp: 17 18 18 18   Temp: 97.9 F (36.6 C) (!) 97.5 F (36.4 C) 97.8 F (36.6 C) 98.5 F (36.9 C)  TempSrc: Oral Oral Oral Oral  SpO2: 98% 97% 99% 100%  Weight:      Height:       No intake or output data in the 24 hours ending 05/27/24 0957 Filed Weights   05/25/24 1718  Weight: 49.9 kg    Examination:  General exam: Appears calm and comfortable  Respiratory system: Bilateral decreased breath sounds at bases Cardiovascular system: S1 & S2 heard, mild intermittent bradycardia present gastrointestinal system: Abdomen is nondistended, soft and mildly tender.  Normal bowel sounds heard. Extremities: No cyanosis, clubbing, edema   Data Reviewed: I have personally reviewed following labs and imaging studies  CBC: Recent Labs  Lab 05/25/24 1719 05/26/24 0617  WBC 5.5 3.3*  HGB 14.5 15.0  HCT 44.9 45.7  MCV 88.9 87.5  PLT 253 241   Basic Metabolic Panel: Recent Labs  Lab 05/25/24 1719 05/26/24 0617  NA 138 136  K 4.2 4.5  CL 101 100  CO2 23 28  GLUCOSE 67* 113*  BUN 9 8  CREATININE 0.89 0.77  CALCIUM 9.3 9.3  MG  --  2.1  PHOS  --  2.2*   GFR: Estimated Creatinine Clearance: 85.8 mL/min (by C-G formula based on SCr of 0.77 mg/dL). Liver Function Tests: Recent Labs  Lab 05/25/24 1719  AST 22  ALT 12  ALKPHOS 69  BILITOT 0.7  PROT 7.3  ALBUMIN 3.7   Recent Labs  Lab 05/25/24 1719  LIPASE 28   No results for input(s): "AMMONIA" in the last 168 hours. Coagulation Profile: No results for input(s): "INR", "PROTIME" in the last 168 hours. Cardiac Enzymes: No results for input(s): "CKTOTAL", "CKMB", "CKMBINDEX", "TROPONINI" in the last 168 hours. BNP (last 3 results) No results for input(s): "PROBNP" in the last 8760 hours. HbA1C: No results for input(s): "HGBA1C" in  the last 72 hours. CBG: No results for input(s): "GLUCAP" in the last 168 hours. Lipid Profile: No results for input(s): "CHOL", "HDL", "LDLCALC", "TRIG", "CHOLHDL", "LDLDIRECT" in the last 72 hours. Thyroid  Function Tests: No results for input(s): "TSH", "T4TOTAL", "FREET4", "T3FREE", "THYROIDAB" in the last 72 hours. Anemia Panel: No results for input(s): "VITAMINB12", "FOLATE", "FERRITIN", "TIBC", "IRON", "RETICCTPCT" in the last 72 hours. Sepsis Labs: No results for input(s): "PROCALCITON", "LATICACIDVEN" in the last 168 hours.  No results found for this or any previous visit (from the past 240 hours).       Radiology Studies: CT ABDOMEN PELVIS W CONTRAST Result Date: 05/26/2024 CLINICAL DATA:  Crohn's exacerbation, back pain and abdominal pain and nausea EXAM: CT ABDOMEN AND PELVIS WITH CONTRAST TECHNIQUE: Multidetector CT imaging of the abdomen and pelvis was performed using the standard protocol following bolus administration of intravenous contrast. RADIATION DOSE REDUCTION: This exam was performed according to the departmental dose-optimization program which includes automated exposure control, adjustment of the mA and/or kV according to patient size and/or use of iterative reconstruction technique. CONTRAST:  75mL OMNIPAQUE  IOHEXOL  350 MG/ML SOLN COMPARISON:  None Available. FINDINGS: Lower chest: No acute abnormality. Hepatobiliary: Unremarkable liver. Normal gallbladder. No biliary dilation. Pancreas: Unremarkable. Spleen: Unremarkable. Adrenals/Urinary Tract: Normal adrenal glands. No urinary calculi or hydronephrosis. Bladder is unremarkable. Stomach/Bowel: Normal caliber large and small bowel. The appendix is normal.Stomach is within normal limits. Wall thickening and mucosal hyperenhancement of the distal approximately 25 cm of ileum. There are alternating segments of narrowing and patulous small bowel within the inflamed segment suggesting inflammatory strictures. No adjacent  abscess or penetrating disease. Vascular/Lymphatic: No significant vascular findings are present. No enlarged abdominal or pelvic lymph nodes. Reproductive: Unremarkable. Other: No free intraperitoneal fluid or air. Musculoskeletal: No acute fracture. Demineralization. 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 IMPRESSION: Active inflammatory bowel disease involving the distal 25 cm of ileum. There are alternating segments of narrowing and patulous small bowel within the inflamed segment suggesting inflammatory strictures. No adjacent abscess or penetrating disease. 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 Electronically Signed   By: Rozell Cornet M.D.   On: 05/26/2024 03:49        Scheduled Meds:  ferrous sulfate   325 mg Oral Q breakfast   Continuous Infusions:        Audria Leather, MD [facility] 05/27/2024, 9:57 AM

## 2024-05-27 NOTE — Progress Notes (Addendum)
 Daily Progress Note  DOA: 05/25/2024 Hospital Day: 3   Cc:  Crohn's flare  Brief History:  43 y.o. 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 Admitted with abdominal pain / active Crohn's on CT scan after being off Remicade since January 2025 due to loss of health insurance.  CT scan demonstrating active inflammatory bowel disease involving the distal 25 cm of ileum. There are alternating segments of narrowing and patulous small bowel within the inflamed segment suggesting inflammatory strictures  Constipation.  No BM in a few days. Took dose of miralax  yesterday. 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 Negative Quantiferon Gold in Jan 2025   Principal Problem:   Crohn's colitis (HCC)   PLAN   --Give another dose of Miralax  now and one at 5pm --Continue Solumedrol 40 mg daily.  --? Maybe home tomorrow on Prednisone  40 mg daily. He should be given a week supply to last until he can touch base with his primary GI at [facility]. . His Medicaid has been renewed and he is already scheduled to have Remicade infusion with primary GI on Tuesday   Subjective   Feels better today than yesterday. He tolerated yogurt. Hasn't had a BM in a few day. Took dose of miralax  yesterday.    Objective   GI Studies:    Recent Labs    05/25/24 1719 05/26/24 0617  WBC 5.5 3.3*  HGB 14.5 15.0  HCT 44.9 45.7  MCV 88.9 87.5  PLT 253 241   No results for input(s): "FOLATE", "VITAMINB12", "FERRITIN", "TIBC", "IRONPCTSAT" in the last 72 hours. Recent Labs    05/25/24 1719 05/26/24 0617  NA 138 136  K 4.2 4.5  CL 101 100  CO2 23 28  GLUCOSE 67* 113*  BUN 9 8  CREATININE  0.89 0.77  CALCIUM 9.3 9.3   Recent Labs    05/25/24 1719  PROT 7.3  ALBUMIN 3.7  AST 22  ALT 12  ALKPHOS 69  BILITOT 0.7     Imaging:  CT ABDOMEN PELVIS W CONTRAST CLINICAL DATA:  Crohn's exacerbation, back pain and abdominal pain and nausea  EXAM: CT ABDOMEN AND PELVIS WITH CONTRAST  TECHNIQUE: Multidetector CT imaging of the abdomen and pelvis was performed using the standard protocol following bolus administration of intravenous contrast.  RADIATION DOSE REDUCTION: This exam was performed according to the departmental dose-optimization program which includes automated exposure control, adjustment of the mA and/or kV according to patient size and/or use of iterative reconstruction technique.  CONTRAST:  75mL OMNIPAQUE  IOHEXOL  350 MG/ML SOLN  COMPARISON:  None Available.  FINDINGS: Lower chest: No acute abnormality.  Hepatobiliary: Unremarkable liver. Normal gallbladder. No biliary dilation.  Pancreas: Unremarkable.  Spleen: Unremarkable.  Adrenals/Urinary Tract: Normal adrenal glands. No urinary calculi or hydronephrosis. Bladder is unremarkable.  Stomach/Bowel: Normal caliber large and small bowel. The appendix is normal.Stomach is within normal limits.  Wall thickening and mucosal hyperenhancement of the distal approximately 25 cm of ileum. There are alternating segments of narrowing and patulous small bowel within the inflamed segment suggesting inflammatory strictures. No adjacent abscess or penetrating disease.  Vascular/Lymphatic: No significant vascular findings are present.  No enlarged abdominal or pelvic lymph nodes.  Reproductive: Unremarkable.  Other: No free intraperitoneal fluid or air.  Musculoskeletal: No acute fracture. Demineralization. 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  IMPRESSION: Active inflammatory bowel disease involving the distal 25 cm of ileum. There are alternating segments of  narrowing and patulous small bowel within the inflamed segment suggesting inflammatory strictures. No adjacent abscess or penetrating disease.  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  Electronically Signed   By: Rozell Cornet M.D.   On: 05/26/2024 03:49     Scheduled inpatient medications:   ferrous sulfate   325 mg Oral Q breakfast   [START ON 05/28/2024] methylPREDNISolone  (SOLU-MEDROL ) injection  40 mg Intravenous Q24H   Continuous inpatient infusions:  PRN inpatient medications: acetaminophen , melatonin, morphine  injection, oxyCODONE , polyethylene glycol, prochlorperazine   Vital signs in last 24 hours: Temp:  [97.5 F (36.4 C)-98.5 F (36.9 C)] 98.5 F (36.9 C) (05/31 0846) Pulse Rate:  [55-74] 64 (05/31 0846) Resp:  [17-18] 18 (05/31 0846) BP: (99-124)/(70-80) 119/79 (05/31 0846) SpO2:  [97 %-100 %] 100 % (05/31 0846) Last BM Date : 05/25/24 No intake or output data in the 24 hours ending 05/27/24 1105  Intake/Output from previous day: No intake/output data recorded. Intake/Output this shift: No intake/output data recorded.   Physical Exam:  General: Alert male in NAD Heart:  Regular rate and rhythm.  Pulmonary: Normal respiratory effort Abdomen: Soft, nondistended, nontender. Normal bowel sounds. Extremities: No lower extremity edema  Neurologic: Alert and oriented Psych: Pleasant. Cooperative     LOS: 1 day   Mai Schwalbe ,NP 05/27/2024, 11:05 AM  I have taken an interval history, thoroughly reviewed the chart and examined the patient. I agree with the Advanced Practitioner's note, impression and recommendations, and have recorded additional findings, impressions and recommendations below. I performed a substantive portion of this encounter (>50% time spent), including a complete performance of the medical decision making.  My additional thoughts are as follows:  Improvement in his pain from yesterday.  No clinical signs obstruction,  able to take oral nutrition and hydration. Will plan for change to prednisone  40 mg daily tomorrow with probable discharge then.  Has a Remicade infusion scheduled for 3 days from now with his usual GI practice, and they can also give him further guidelines on tapering the prednisone .   Kerby Pearson III Office:[phone]

## 2024-05-27 NOTE — Plan of Care (Signed)
  Problem: Pain Managment: Goal: General experience of comfort will improve and/or be controlled Outcome: Not Progressing   Problem: Elimination: Goal: Will not experience complications related to bowel motility Outcome: Not Progressing   Problem: Coping: Goal: Level of anxiety will decrease Outcome: Not Progressing

## 2024-05-28 DIAGNOSIS — K50119 Crohn's disease of large intestine with unspecified complications: Secondary | ICD-10-CM | POA: Diagnosis not present

## 2024-05-28 MED ORDER — OXYCODONE HCL 5 MG PO TABS: 5.0000 mg | ORAL_TABLET | Freq: Four times a day (QID) | ORAL | 0 refills | Status: AC | PRN

## 2024-05-28 MED ORDER — POLYETHYLENE GLYCOL 3350 17 G PO PACK: 17.0000 g | PACK | Freq: Every day | ORAL | 0 refills | Status: AC | PRN

## 2024-05-28 MED ORDER — FERROUS SULFATE 325 (65 FE) MG PO TABS: 325.0000 mg | ORAL_TABLET | Freq: Every day | ORAL | Status: AC

## 2024-05-28 MED ORDER — PREDNISONE 20 MG PO TABS: 40.0000 mg | ORAL_TABLET | Freq: Every day | ORAL | 0 refills | Status: AC

## 2024-05-28 NOTE — Discharge Summary (Signed)
 Physician Discharge Summary   Perlman ZOX:[id number] DOB: 12-29-1980 DOA: 05/25/2024  PCP: Lemon Qua., MD  Admit date: 05/25/2024 Discharge date: 05/28/2024  Admitted From: Home Disposition: Home  Recommendations for Outpatient Follow-up:  Follow up with PCP in 1 week  Outpatient follow-up with regular GI. Comply with medications and follow-up Follow up in ED if symptoms worsen or new appear   Home Health: No Equipment/Devices: None  Discharge Condition: Stable CODE STATUS: Full Diet recommendation: Regular  Brief/Interim Summary: 43 y.o. male with medical history significant of Crohn's disease currently not on IV infusion since January due to insurance issues presented with worsening abdominal pain, nausea and vomiting ongoing for the last 3 weeks.  On presentation, CT of the abdomen and pelvis with contrast showed findings suggestive of possible Crohn's disease flare without abscess. He was started on steroids. GI was consulted.  He was hospitalized, his condition has improved.  He is tolerating diet.  GI recommending discharging home on oral prednisone  till reevaluation by his primary GI.  Outpatient follow-up with PCP and GI.  Discharge patient home today.  Discharge Diagnoses:   Possible Crohn's disease flare in a patient with history of Crohn's disease currently not on treatment because of lack of insurance - Presented with worsening abdominal pain, nausea and vomiting for the last 3 weeks.  Afebrile with no leukocytosis.  Imaging as above. - GI evaluation appreciated: Currently on IV Solu-Medrol .  Patient is scheduled to get Remicade on 05/30/2024 at [facility] health.  GI recommends to continue outpatient follow-up with patient's primary gastroenterologist as an outpatient.   - Condition has improved.  Tolerating diet.  Feels okay to go home today.  GI has cleared the patient for discharge home on prednisone  40 mg daily for now till patient gets evaluated by his primary  GI as an outpatient -Discharge patient home today.   Leukopenia - Mild.  No labs today.  Discharge Instructions   Allergies as of 05/28/2024       Reactions   Motrin [ibuprofen] Other (See Comments)   Stomach pain        Medication List     TAKE these medications    acetaminophen  500 MG tablet Commonly known as: TYLENOL  Take 1,000 mg by mouth 2 (two) times daily as needed for moderate pain (pain score 4-6), headache or fever.   ferrous sulfate  325 (65 FE) MG tablet Commonly known as: FerrouSul Take 1 tablet (325 mg total) by mouth daily.   oxyCODONE  5 MG immediate release tablet Commonly known as: Oxy IR/ROXICODONE  Take 1 tablet (5 mg total) by mouth every 6 (six) hours as needed for severe pain (pain score 7-10).   polyethylene glycol 17 g packet Commonly known as: MIRALAX  / GLYCOLAX  Take 17 g by mouth daily as needed for mild constipation.   predniSONE  20 MG tablet Commonly known as: DELTASONE  Take 2 tablets (40 mg total) by mouth daily with breakfast.        Follow-up Information     Lemon Qua., MD. Schedule an appointment as soon as possible for a visit in 1 week(s).   Specialty: Internal Medicine Contact information: [address] [place] [address] [facility] [place] [address] [phone]                Allergies  Allergen Reactions   Motrin [Ibuprofen] Other (See Comments)    Stomach pain    Consultations: GI   Procedures/Studies: CT ABDOMEN PELVIS W CONTRAST Result Date: 05/26/2024 CLINICAL DATA:  Crohn's exacerbation,  back pain and abdominal pain and nausea EXAM: CT ABDOMEN AND PELVIS WITH CONTRAST TECHNIQUE: Multidetector CT imaging of the abdomen and pelvis was performed using the standard protocol following bolus administration of intravenous contrast. RADIATION DOSE REDUCTION: This exam was performed according to the departmental dose-optimization program which includes automated exposure control, adjustment of the mA and/or kV  according to patient size and/or use of iterative reconstruction technique. CONTRAST:  75mL OMNIPAQUE  IOHEXOL  350 MG/ML SOLN COMPARISON:  None Available. FINDINGS: Lower chest: No acute abnormality. Hepatobiliary: Unremarkable liver. Normal gallbladder. No biliary dilation. Pancreas: Unremarkable. Spleen: Unremarkable. Adrenals/Urinary Tract: Normal adrenal glands. No urinary calculi or hydronephrosis. Bladder is unremarkable. Stomach/Bowel: Normal caliber large and small bowel. The appendix is normal.Stomach is within normal limits. Wall thickening and mucosal hyperenhancement of the distal approximately 25 cm of ileum. There are alternating segments of narrowing and patulous small bowel within the inflamed segment suggesting inflammatory strictures. No adjacent abscess or penetrating disease. Vascular/Lymphatic: No significant vascular findings are present. No enlarged abdominal or pelvic lymph nodes. Reproductive: Unremarkable. Other: No free intraperitoneal fluid or air. Musculoskeletal: No acute fracture. Demineralization. 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 IMPRESSION: Active inflammatory bowel disease involving the distal 25 cm of ileum. There are alternating segments of narrowing and patulous small bowel within the inflamed segment suggesting inflammatory strictures. No adjacent abscess or penetrating disease. 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 Electronically Signed   By: Rozell Cornet M.D.   On: 05/26/2024 03:49      Subjective: Patient seen and examined at bedside.  Feels better and feels okay to go home today.  No fever, vomiting, worsening abdominal pain reported.  Discharge Exam: Vitals:   05/27/24 1945 05/28/24 0415  BP: 116/79 106/71  Pulse: 66 71  Resp: 18 18  Temp: 98.6 F (37 C) 98 F (36.7 C)  SpO2: 99% 99%    General: Pt is alert, awake, not in acute distress. Cardiovascular: rate controlled, S1/S2 + Respiratory:  bilateral decreased breath sounds at bases Abdominal: Soft, mild tender, ND, bowel sounds + Extremities: no edema, no cyanosis    The results of significant diagnostics from this hospitalization (including imaging, microbiology, ancillary and laboratory) are listed below for reference.     Microbiology: No results found for this or any previous visit (from the past 240 hours).   Labs: BNP (last 3 results) No results for input(s): "BNP" in the last 8760 hours. Basic Metabolic Panel: Recent Labs  Lab 05/25/24 1719 05/26/24 0617  NA 138 136  K 4.2 4.5  CL 101 100  CO2 23 28  GLUCOSE 67* 113*  BUN 9 8  CREATININE 0.89 0.77  CALCIUM 9.3 9.3  MG  --  2.1  PHOS  --  2.2*   Liver Function Tests: Recent Labs  Lab 05/25/24 1719  AST 22  ALT 12  ALKPHOS 69  BILITOT 0.7  PROT 7.3  ALBUMIN 3.7   Recent Labs  Lab 05/25/24 1719  LIPASE 28   No results for input(s): "AMMONIA" in the last 168 hours. CBC: Recent Labs  Lab 05/25/24 1719 05/26/24 0617  WBC 5.5 3.3*  HGB 14.5 15.0  HCT 44.9 45.7  MCV 88.9 87.5  PLT 253 241   Cardiac Enzymes: No results for input(s): "CKTOTAL", "CKMB", "CKMBINDEX", "TROPONINI" in the last 168 hours. BNP: Invalid input(s): "POCBNP" CBG: No results for input(s): "GLUCAP" in the last 168 hours. D-Dimer No results for input(s): "DDIMER" in the last  72 hours. Hgb A1c No results for input(s): "HGBA1C" in the last 72 hours. Lipid Profile No results for input(s): "CHOL", "HDL", "LDLCALC", "TRIG", "CHOLHDL", "LDLDIRECT" in the last 72 hours. Thyroid  function studies No results for input(s): "TSH", "T4TOTAL", "T3FREE", "THYROIDAB" in the last 72 hours.  Invalid input(s): "FREET3" Anemia work up No results for input(s): "VITAMINB12", "FOLATE", "FERRITIN", "TIBC", "IRON", "RETICCTPCT" in the last 72 hours. Urinalysis    Component Value Date/Time   COLORURINE YELLOW 05/25/2024 1719   APPEARANCEUR CLEAR 05/25/2024 1719   LABSPEC 1.019  05/25/2024 1719   PHURINE 5.0 05/25/2024 1719   GLUCOSEU NEGATIVE 05/25/2024 1719   HGBUR SMALL (A) 05/25/2024 1719   BILIRUBINUR NEGATIVE 05/25/2024 1719   KETONESUR 20 (A) 05/25/2024 1719   PROTEINUR NEGATIVE 05/25/2024 1719   NITRITE NEGATIVE 05/25/2024 1719   LEUKOCYTESUR NEGATIVE 05/25/2024 1719   Sepsis Labs Recent Labs  Lab 05/25/24 1719 05/26/24 0617  WBC 5.5 3.3*   Microbiology No results found for this or any previous visit (from the past 240 hours).   Time coordinating discharge: 35 minutes  SIGNED:   Audria Leather, MD  [facility] 05/28/2024, 8:25 AM

## 2024-05-28 NOTE — Progress Notes (Signed)
 Pt has DC order. AVS was given and explained to pt and family at bedside, all questions were answered. NT escorted pt out of the hospital, pt refused wheelchair.
